# Patient Record
Sex: Male | Born: 1950 | Race: Black or African American | Hispanic: No | Smoking: Current some day smoker
Health system: Southern US, Community
[De-identification: ages and names within clinical notes are randomized; demographics above are authoritative.]

## PROBLEM LIST (undated history)

## (undated) DIAGNOSIS — R011 Cardiac murmur, unspecified: Secondary | ICD-10-CM

## (undated) DIAGNOSIS — I209 Angina pectoris, unspecified: Secondary | ICD-10-CM

## (undated) DIAGNOSIS — I517 Cardiomegaly: Secondary | ICD-10-CM

## (undated) DIAGNOSIS — I10 Essential (primary) hypertension: Secondary | ICD-10-CM

## (undated) DIAGNOSIS — I509 Heart failure, unspecified: Secondary | ICD-10-CM

## (undated) HISTORY — PX: NO PAST SURGERIES: SHX2092

---

## 2004-07-16 ENCOUNTER — Emergency Department: Payer: Self-pay | Admitting: Emergency Medicine

## 2005-01-24 ENCOUNTER — Emergency Department: Payer: Self-pay | Admitting: Emergency Medicine

## 2005-02-27 ENCOUNTER — Emergency Department: Payer: Self-pay | Admitting: Emergency Medicine

## 2006-03-16 IMAGING — CR DG LUMBAR SPINE 2-3V
1 series · 3 of 3 positions shown · non-contrast
Comparison: none

REASON FOR EXAM: Pain
COMMENTS:

[Series 1: view not recorded · 0.17mm/px · 3 of 3 slices shown]
[im 1/3]
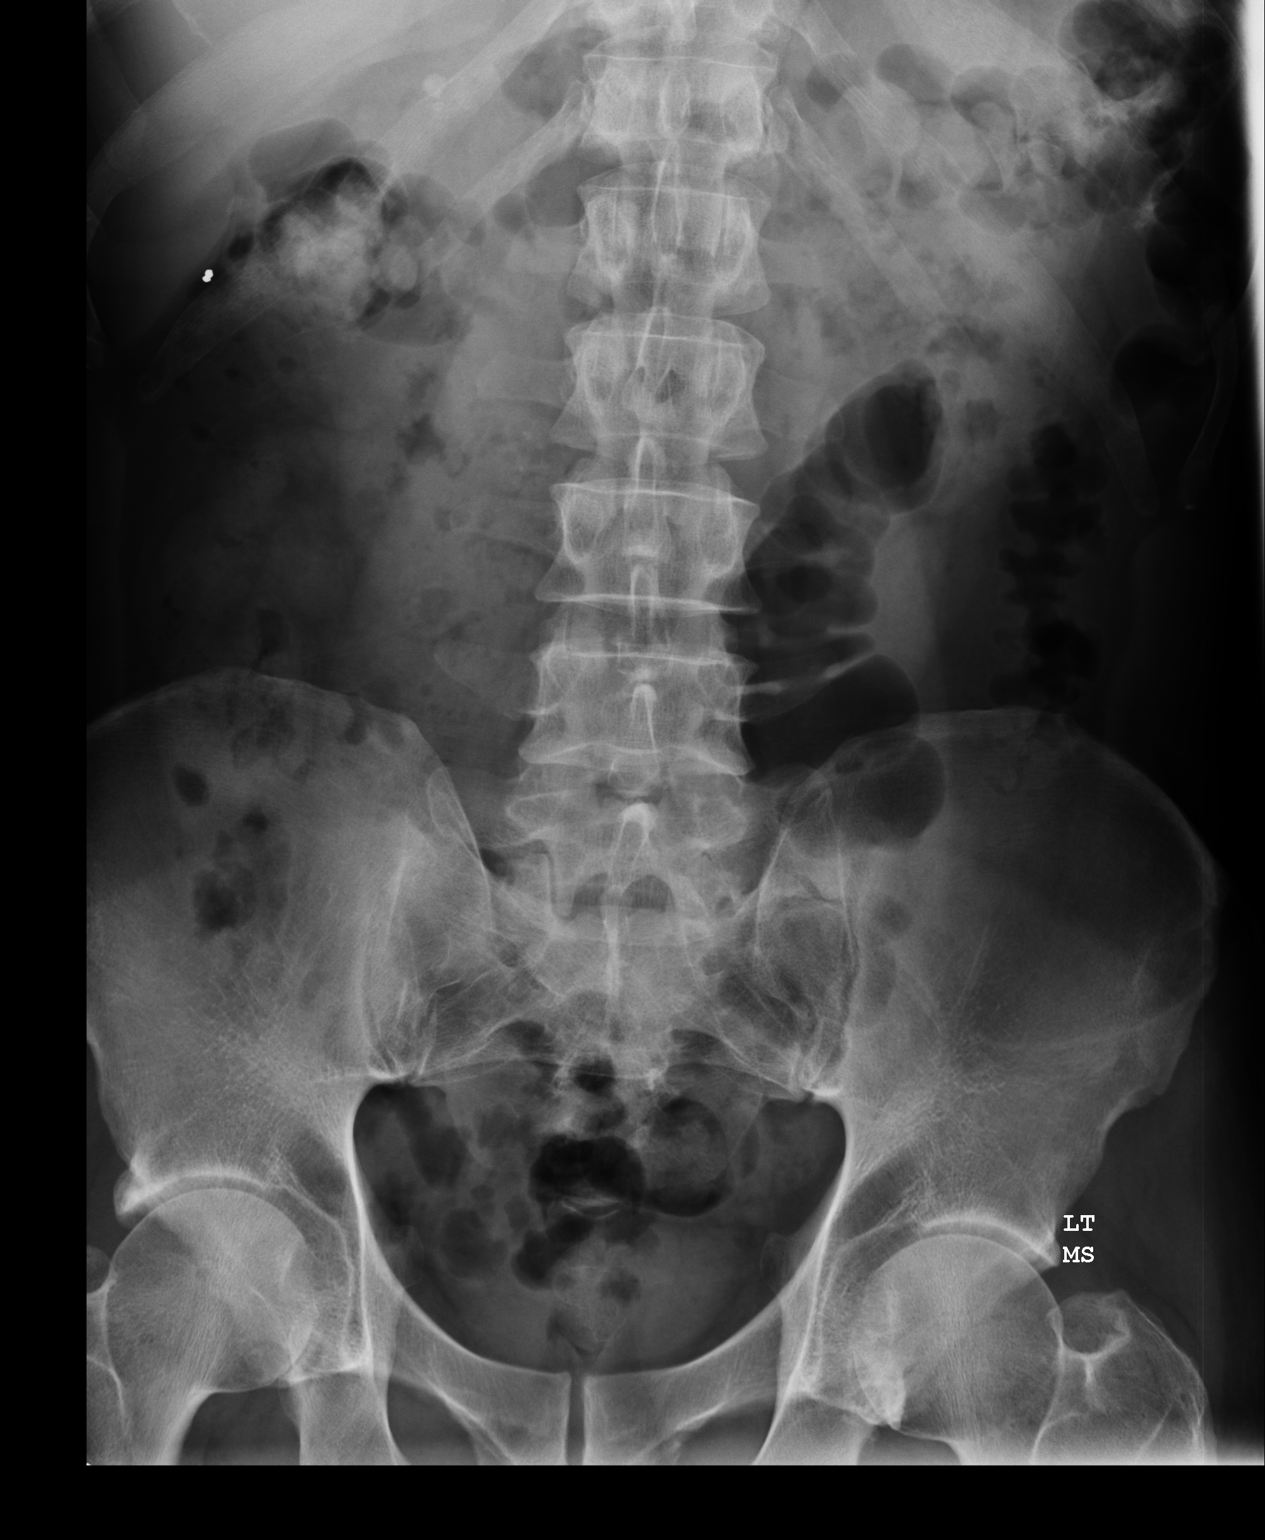
[im 2/3]
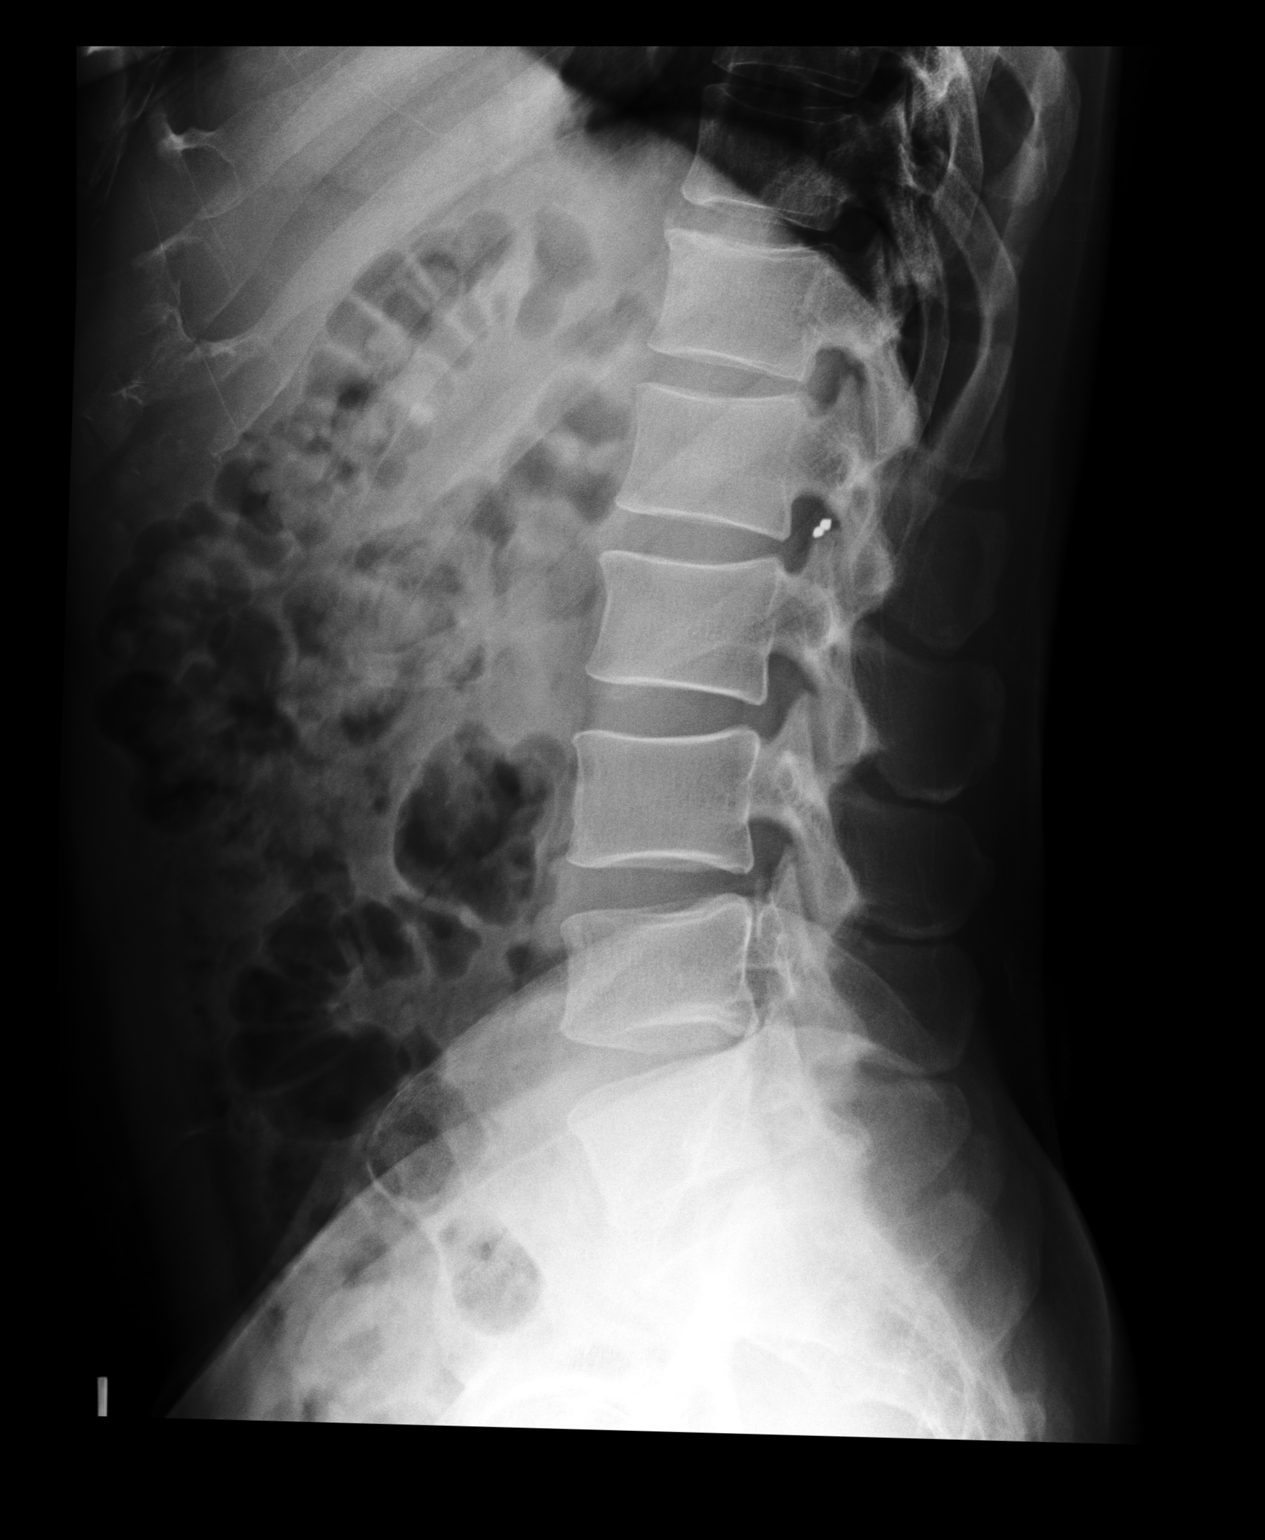
[im 3/3]
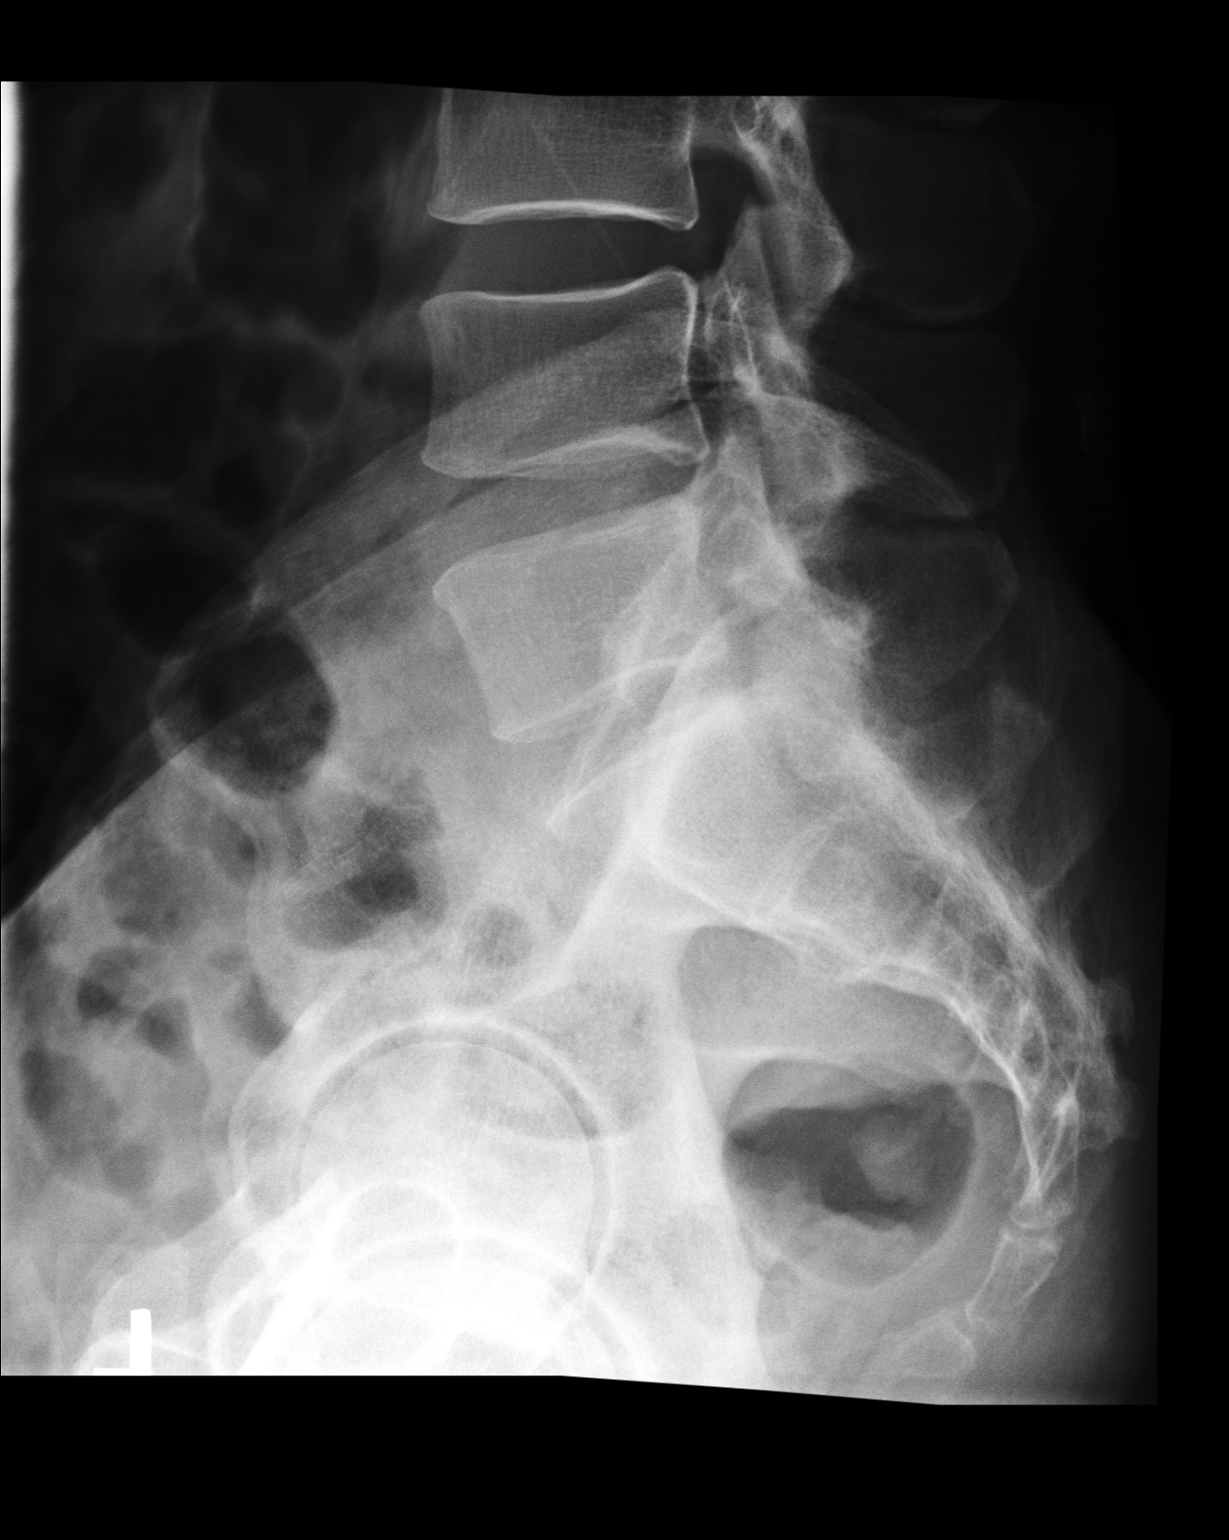

[3 of 3 positions shown; findings below may reference images not displayed]

PROCEDURE:     DXR - DXR LUMBAR SPINE AP AND LATERAL  - January 24, 2005  [DATE]

RESULT:     No acute soft tissue or bony abnormalities are identified.
There is no evidence of fracture or dislocation.  Metallic density is noted
over the RIGHT back.  Calcification is noted over the RIGHT upper quadrant.
This could represent a gallstone or a kidney stone.
IMPRESSION: Please see above.

## 2006-03-16 IMAGING — CR DG HIP COMPLETE 2+V*L*
1 series · 2 of 2 positions shown · non-contrast
Comparison: none

REASON FOR EXAM: Pain in hip
COMMENTS:

[Series 1: view not recorded · 0.17mm/px · 2 of 2 slices shown]
[im 1/2]
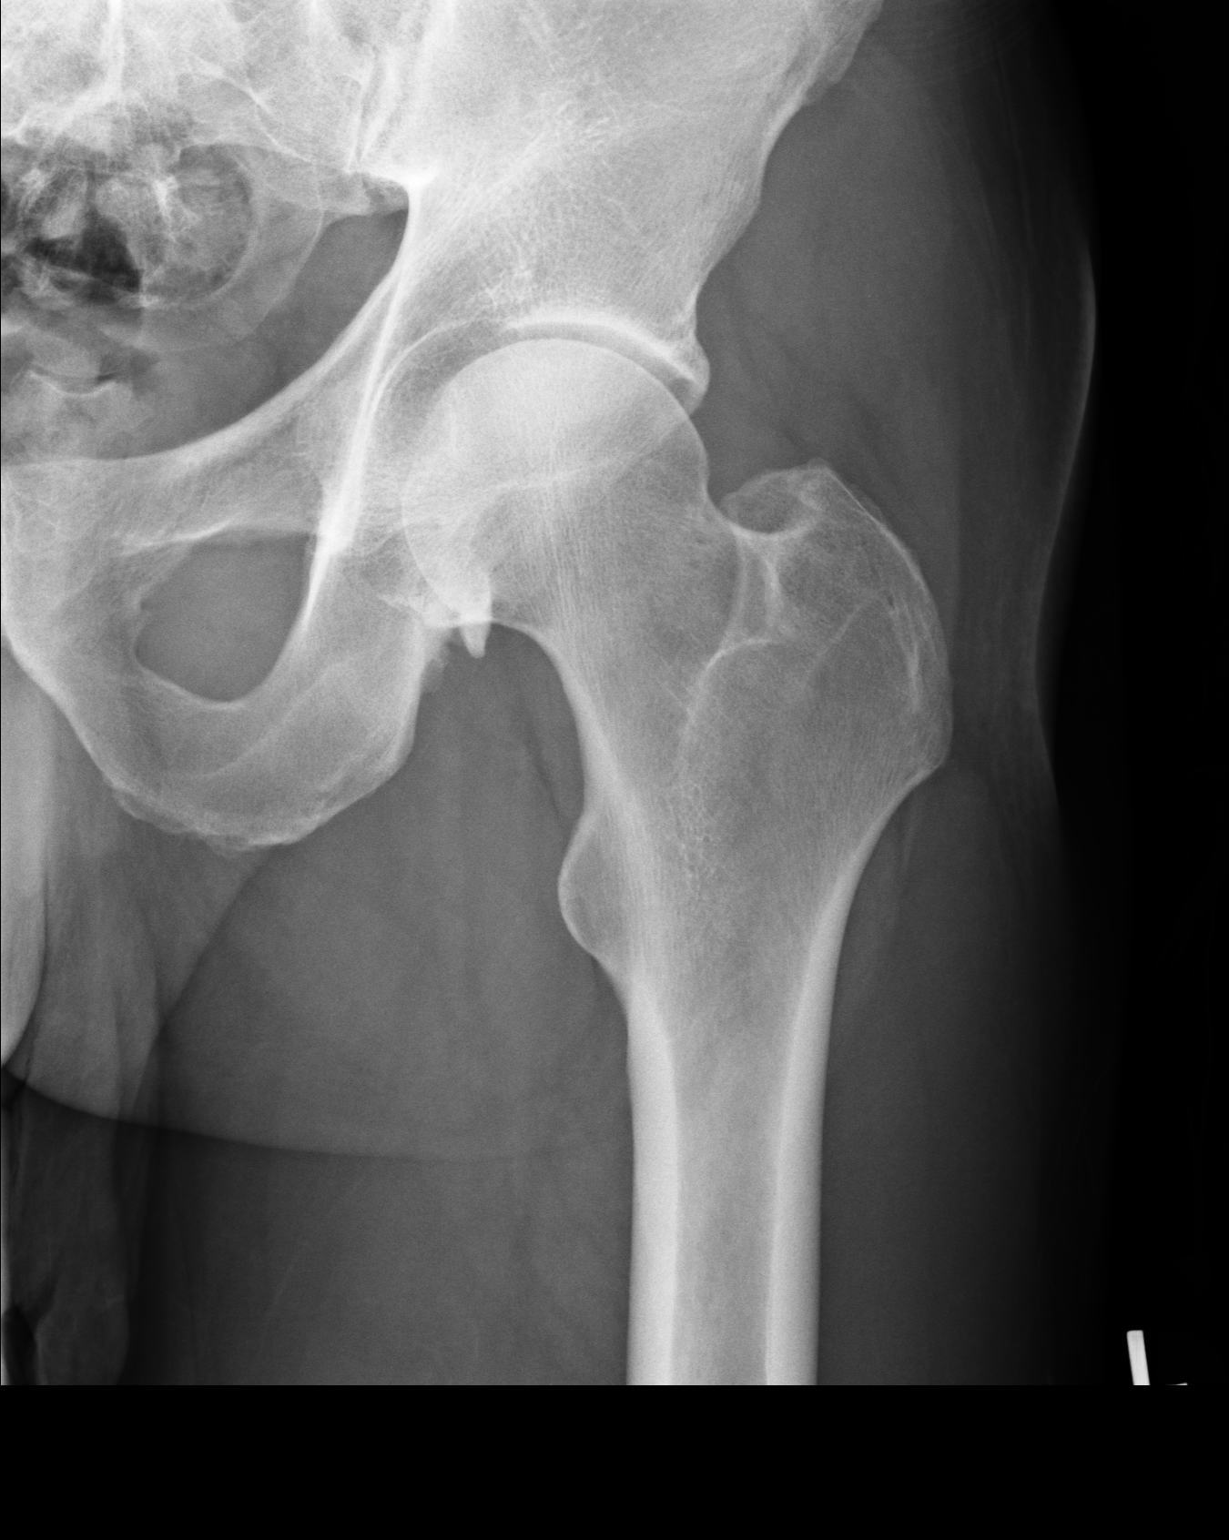
[im 2/2]
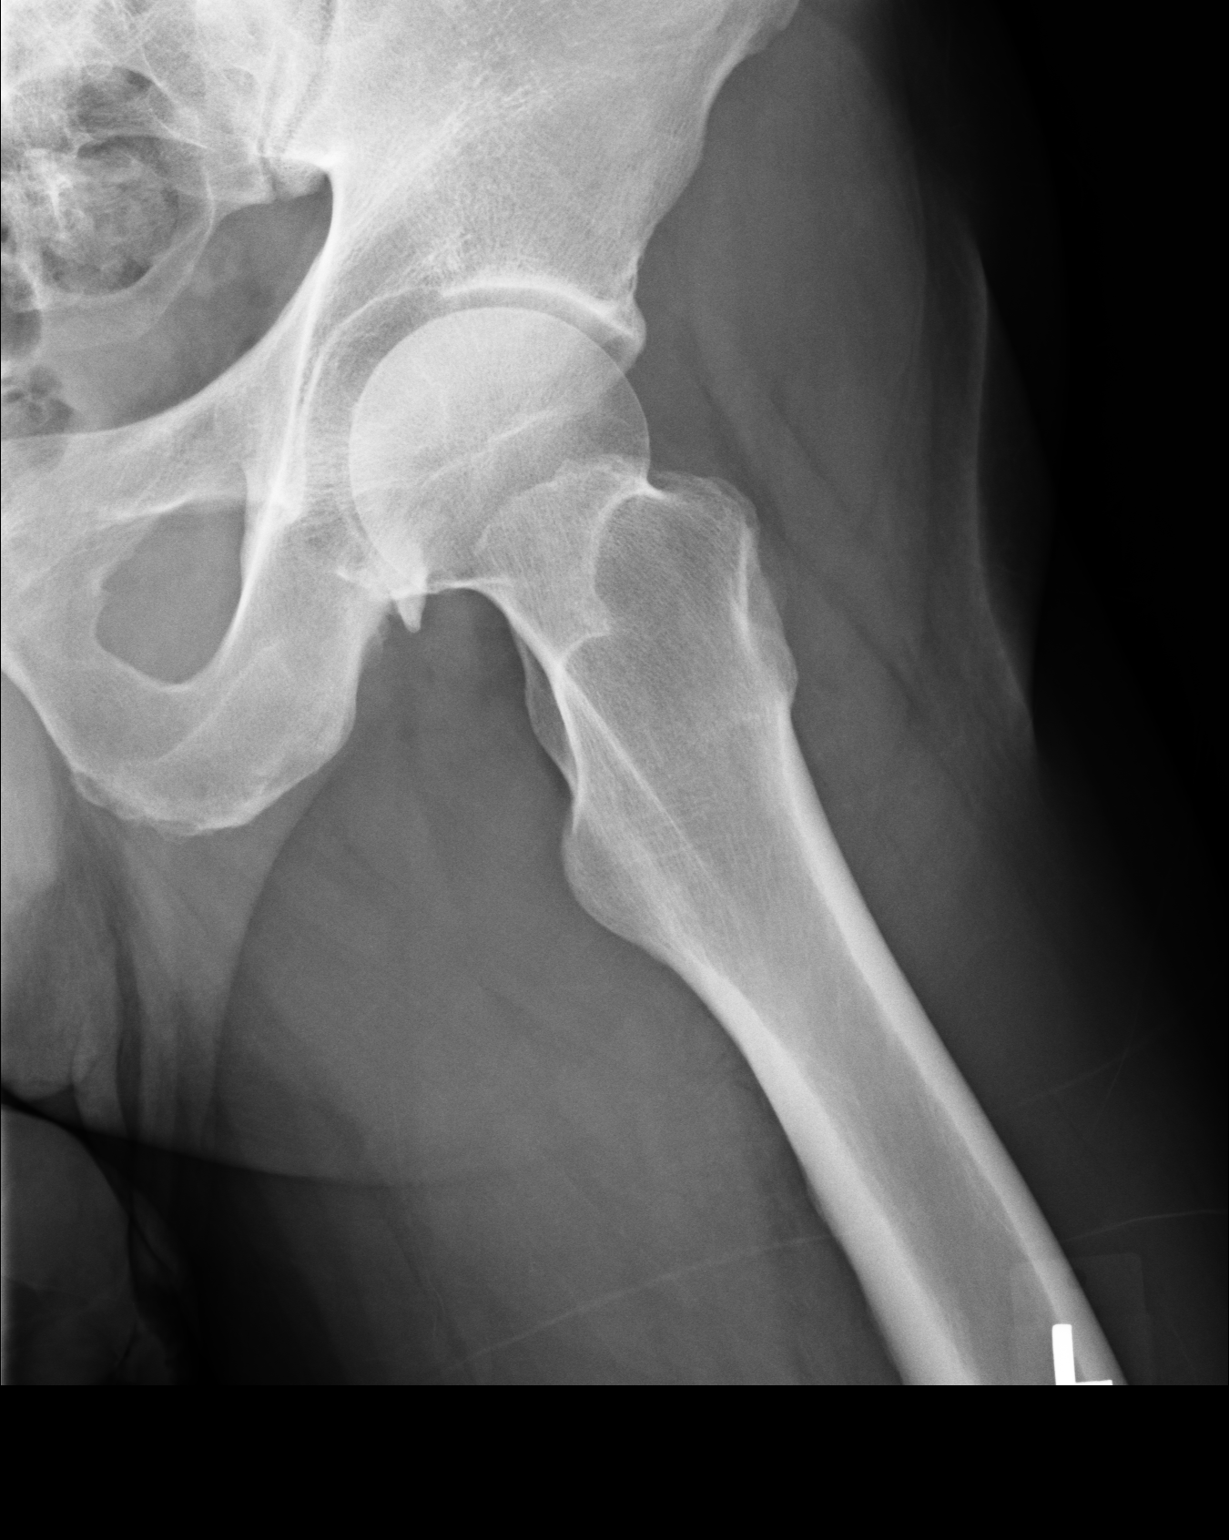

[2 of 2 positions shown; findings below may reference images not displayed]

PROCEDURE:     DXR - DXR HIP LEFT COMPLETE  - January 24, 2005  [DATE]

RESULT:     Deformity is noted of the inferior aspect of the acetabulum.
This may be degenerative or from prior trauma. Ligamentous
calcification/ossification could also present in this fashion. To exclude an
active process, LEFT hip MRI should be considered. There is no evidence of
acute fracture noted.
IMPRESSION: Please see above.

## 2006-03-16 IMAGING — CR PELVIS - 1-2 VIEW
1 series · 1 of 1 positions shown · non-contrast
Comparison: none

REASON FOR EXAM: Pain in pelvis.  [HOSPITAL]
COMMENTS:

[view not recorded]
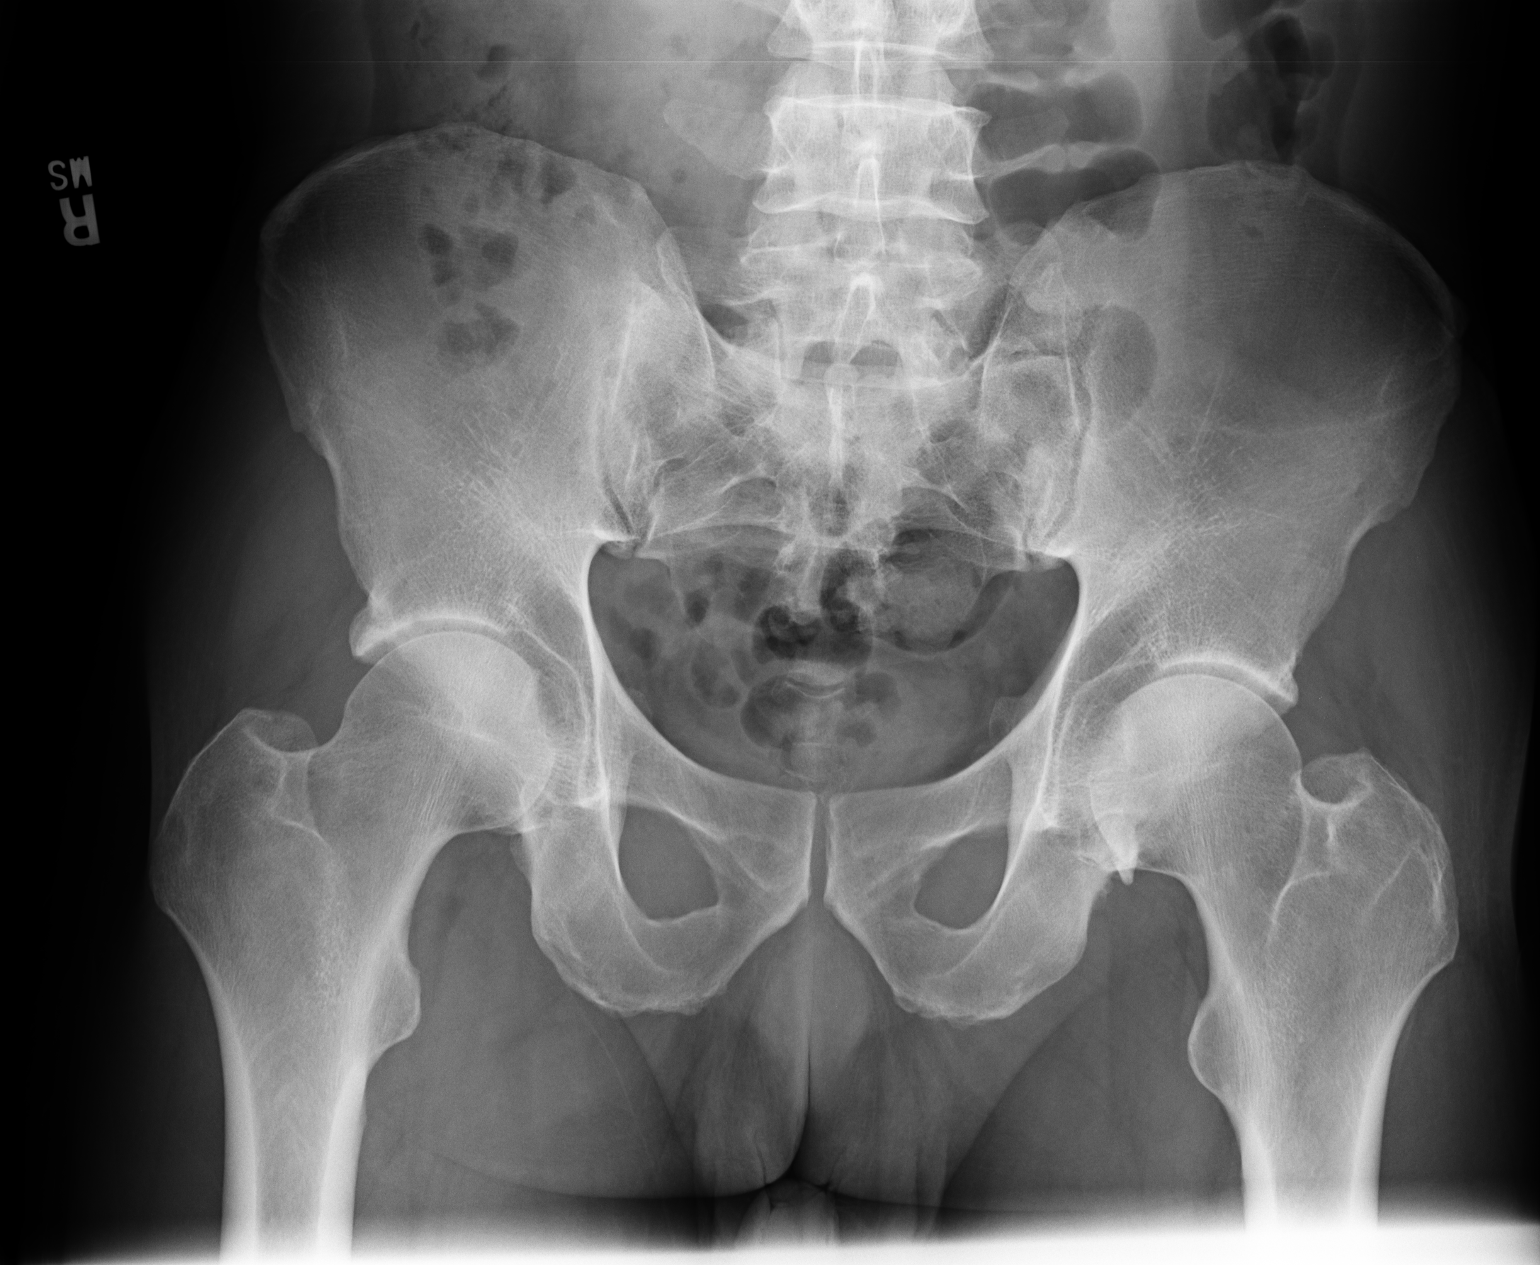

[1 of 1 positions shown; findings below may reference images not displayed]

PROCEDURE:     DXR - DXR PELVIS AP ONLY  - January 24, 2005  [DATE]

RESULT:     Deformity is noted along the inferior acetabular rim.  This may
be a site of old trauma.  Ligamentous calcification and degenerative change
could also present in this fashion.  Given the patient's history of pain, it
may be prudent to perform a LEFT hip MRI to further evaluate this region to
exclude active process.  No acute fracture is noted.  No other focal
abnormalities are identified.
IMPRESSION: Please see above.

## 2006-12-30 ENCOUNTER — Emergency Department: Payer: Self-pay | Admitting: Emergency Medicine

## 2009-06-21 ENCOUNTER — Inpatient Hospital Stay: Payer: Self-pay | Admitting: Internal Medicine

## 2009-07-15 ENCOUNTER — Ambulatory Visit: Payer: Self-pay | Admitting: Family

## 2009-08-11 ENCOUNTER — Ambulatory Visit: Payer: Self-pay | Admitting: Family

## 2009-10-31 ENCOUNTER — Inpatient Hospital Stay: Payer: Self-pay | Admitting: Internal Medicine

## 2010-07-06 ENCOUNTER — Observation Stay: Payer: Self-pay | Admitting: Internal Medicine

## 2011-12-05 ENCOUNTER — Observation Stay (HOSPITAL_COMMUNITY)
Admission: EM | Admit: 2011-12-05 | Discharge: 2011-12-06 | Disposition: A | Discharge status: Home / Self Care | Payer: Medicare Other | Attending: Cardiology | Admitting: Cardiology

## 2011-12-05 ENCOUNTER — Emergency Department (HOSPITAL_COMMUNITY): Payer: Medicare Other

## 2011-12-05 ENCOUNTER — Encounter (HOSPITAL_COMMUNITY): Payer: Self-pay | Admitting: Adult Health

## 2011-12-05 DIAGNOSIS — I5022 Chronic systolic (congestive) heart failure: Secondary | ICD-10-CM | POA: Insufficient documentation

## 2011-12-05 DIAGNOSIS — I509 Heart failure, unspecified: Secondary | ICD-10-CM | POA: Insufficient documentation

## 2011-12-05 DIAGNOSIS — I517 Cardiomegaly: Secondary | ICD-10-CM

## 2011-12-05 DIAGNOSIS — R9431 Abnormal electrocardiogram [ECG] [EKG]: Secondary | ICD-10-CM | POA: Diagnosis present

## 2011-12-05 DIAGNOSIS — F17200 Nicotine dependence, unspecified, uncomplicated: Secondary | ICD-10-CM | POA: Insufficient documentation

## 2011-12-05 DIAGNOSIS — I10 Essential (primary) hypertension: Secondary | ICD-10-CM | POA: Insufficient documentation

## 2011-12-05 DIAGNOSIS — R079 Chest pain, unspecified: Principal | ICD-10-CM | POA: Insufficient documentation

## 2011-12-05 HISTORY — DX: Cardiomegaly: I51.7

## 2011-12-05 HISTORY — DX: Angina pectoris, unspecified: I20.9

## 2011-12-05 HISTORY — DX: Essential (primary) hypertension: I10

## 2011-12-05 HISTORY — DX: Heart failure, unspecified: I50.9

## 2011-12-05 LAB — APTT: aPTT: 29 seconds (ref 24–37)

## 2011-12-05 LAB — CARDIAC PANEL(CRET KIN+CKTOT+MB+TROPI)
CK, MB: 1.7 ng/mL (ref 0.3–4.0)
Relative Index: 0.9 (ref 0.0–2.5)
Total CK: 188 U/L (ref 7–232)

## 2011-12-05 LAB — CBC
HCT: 37 % — ABNORMAL LOW (ref 39.0–52.0)
HCT: 35 % — ABNORMAL LOW (ref 39.0–52.0)
Hemoglobin: 12.1 g/dL — ABNORMAL LOW (ref 13.0–17.0)
MCHC: 32.6 g/dL (ref 30.0–36.0)
MCV: 76.8 fL — ABNORMAL LOW (ref 78.0–100.0)
MCV: 76.8 fL — ABNORMAL LOW (ref 78.0–100.0)
Platelets: 248 10*3/uL (ref 150–400)
RBC: 4.82 MIL/uL (ref 4.22–5.81)
RDW: 14.7 % (ref 11.5–15.5)
WBC: 6.4 10*3/uL (ref 4.0–10.5)

## 2011-12-05 LAB — BASIC METABOLIC PANEL
CO2: 30 mEq/L (ref 19–32)
Chloride: 102 mEq/L (ref 96–112)
Creatinine, Ser: 1.11 mg/dL (ref 0.50–1.35)

## 2011-12-05 LAB — COMPREHENSIVE METABOLIC PANEL
ALT: 12 U/L (ref 0–53)
AST: 22 U/L (ref 0–37)
CO2: 31 mEq/L (ref 19–32)
Calcium: 9.3 mg/dL (ref 8.4–10.5)
GFR calc non Af Amer: 62 mL/min — ABNORMAL LOW (ref >90)
Sodium: 140 mEq/L (ref 135–145)
Total Protein: 7 g/dL (ref 6.0–8.3)

## 2011-12-05 LAB — POCT I-STAT TROPONIN I: Troponin i, poc: 0.01 ng/mL (ref 0.00–0.08)

## 2011-12-05 LAB — PROTIME-INR: Prothrombin Time: 14.4 seconds (ref 11.6–15.2)

## 2011-12-05 MED ORDER — SODIUM CHLORIDE 0.9 % IJ SOLN: 3.0000 mL | INTRAMUSCULAR | Status: DC | PRN

## 2011-12-05 MED ORDER — NITROGLYCERIN 0.4 MG SL SUBL
0.4000 mg | SUBLINGUAL_TABLET | SUBLINGUAL | Status: DC | PRN
  Filled 2011-12-05: qty 25

## 2011-12-05 MED ORDER — NITROGLYCERIN 0.4 MG SL SUBL: 0.4000 mg | SUBLINGUAL_TABLET | SUBLINGUAL | Status: DC | PRN

## 2011-12-05 MED ORDER — FUROSEMIDE 20 MG PO TABS
20.0000 mg | ORAL_TABLET | Freq: Every day | ORAL | Status: DC
  Filled 2011-12-05 (×2): qty 1

## 2011-12-05 MED ORDER — SODIUM CHLORIDE 0.9 % IV SOLN: 250.0000 mL | INTRAVENOUS | Status: DC | PRN

## 2011-12-05 MED ORDER — ASPIRIN 81 MG PO CHEW
243.0000 mg | CHEWABLE_TABLET | Freq: Once | ORAL | Status: AC
  Administered 2011-12-05: 15:00:00 via ORAL

## 2011-12-05 MED ORDER — ASPIRIN 81 MG PO CHEW: 324.0000 mg | CHEWABLE_TABLET | ORAL | Status: DC

## 2011-12-05 MED ORDER — ASPIRIN 300 MG RE SUPP
300.0000 mg | RECTAL | Status: DC
  Filled 2011-12-05: qty 1

## 2011-12-05 MED ORDER — LISINOPRIL 5 MG PO TABS
5.0000 mg | ORAL_TABLET | Freq: Two times a day (BID) | ORAL | Status: DC
  Administered 2011-12-05: 5 mg via ORAL
  Filled 2011-12-05 (×3): qty 1

## 2011-12-05 MED ORDER — ASPIRIN EC 81 MG PO TBEC
81.0000 mg | DELAYED_RELEASE_TABLET | Freq: Every day | ORAL | Status: DC
  Filled 2011-12-05 (×2): qty 1

## 2011-12-05 MED ORDER — AMLODIPINE BESYLATE 5 MG PO TABS
5.0000 mg | ORAL_TABLET | Freq: Every evening | ORAL | Status: DC
  Filled 2011-12-05 (×2): qty 1

## 2011-12-05 MED ORDER — ONDANSETRON HCL 4 MG/2ML IJ SOLN: 4.0000 mg | Freq: Four times a day (QID) | INTRAMUSCULAR | Status: DC | PRN

## 2011-12-05 MED ORDER — HEPARIN SODIUM (PORCINE) 5000 UNIT/ML IJ SOLN
5000.0000 [IU] | Freq: Three times a day (TID) | INTRAMUSCULAR | Status: DC
  Administered 2011-12-05: 5000 [IU] via SUBCUTANEOUS
  Filled 2011-12-05 (×5): qty 1

## 2011-12-05 MED ORDER — ASPIRIN EC 81 MG PO TBEC
81.0000 mg | DELAYED_RELEASE_TABLET | Freq: Every day | ORAL | Status: DC
  Filled 2011-12-05: qty 1

## 2011-12-05 MED ORDER — ACETAMINOPHEN 325 MG PO TABS: 650.0000 mg | ORAL_TABLET | ORAL | Status: DC | PRN

## 2011-12-05 MED ORDER — ASPIRIN 81 MG PO CHEW
CHEWABLE_TABLET | ORAL | Status: AC
  Filled 2011-12-05: qty 4

## 2011-12-05 MED ORDER — ASPIRIN 325 MG PO TABS: 325.0000 mg | ORAL_TABLET | ORAL | Status: DC

## 2011-12-05 MED ORDER — SPIRONOLACTONE 12.5 MG HALF TABLET
12.5000 mg | ORAL_TABLET | Freq: Every day | ORAL | Status: DC
  Filled 2011-12-05 (×2): qty 1

## 2011-12-05 MED ORDER — SODIUM CHLORIDE 0.9 % IJ SOLN
3.0000 mL | Freq: Two times a day (BID) | INTRAMUSCULAR | Status: DC
  Administered 2011-12-05: 3 mL via INTRAVENOUS

## 2011-12-05 MED ORDER — CARVEDILOL 12.5 MG PO TABS
12.5000 mg | ORAL_TABLET | Freq: Two times a day (BID) | ORAL | Status: DC
  Administered 2011-12-06: 12.5 mg via ORAL
  Filled 2011-12-05 (×3): qty 1

## 2011-12-05 NOTE — ED Notes (Signed)
Pt denies pain at this time
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
## 2011-12-05 NOTE — ED Notes (Signed)
C/o intermittent chest pain every 15 minutes that began 2 days ago located in center of chest associated with SOB described as sharp, nothing makes the pain better and nothing makes the pain worse. HX CHF and hypertension.

## 2011-12-05 NOTE — ED Provider Notes (Signed)
I saw and evaluated the patient, reviewed the resident's note and I agree with the findings and plan.   .Face to face Exam:  General:  Awake HEENT:  Atraumatic Resp:  Normal effort Abd:  Nondistended Neuro:No focal weakness Lymph: No adenopathy   Nelia Shi, MD 12/05/11 2010

## 2011-12-06 LAB — CBC
HCT: 34.4 % — ABNORMAL LOW (ref 39.0–52.0)
Hemoglobin: 11.1 g/dL — ABNORMAL LOW (ref 13.0–17.0)
MCV: 76.6 fL — ABNORMAL LOW (ref 78.0–100.0)
RBC: 4.49 MIL/uL (ref 4.22–5.81)
WBC: 7 10*3/uL (ref 4.0–10.5)

## 2011-12-06 LAB — CARDIAC PANEL(CRET KIN+CKTOT+MB+TROPI): Total CK: 172 U/L (ref 7–232)

## 2011-12-06 LAB — BASIC METABOLIC PANEL
BUN: 16 mg/dL (ref 6–23)
CO2: 30 mEq/L (ref 19–32)
Chloride: 98 mEq/L (ref 96–112)
Creatinine, Ser: 1.29 mg/dL (ref 0.50–1.35)
Glucose, Bld: 106 mg/dL — ABNORMAL HIGH (ref 70–99)

## 2011-12-06 LAB — LIPID PANEL: LDL Cholesterol: 82 mg/dL (ref 0–99)

## 2011-12-06 MED ORDER — POTASSIUM CHLORIDE CRYS ER 20 MEQ PO TBCR: 40.0000 meq | EXTENDED_RELEASE_TABLET | Freq: Once | ORAL | Status: DC

## 2011-12-06 NOTE — Progress Notes (Signed)
Pt discharged home, HF video shown this am and booklet given.  Pt verbalizes understanding.  Pt taken of cardiac monitor, no pain stated.  NS at time of discharge.  Pt aware to follow up with Dr. Pernell Dupre, PCP.  Pt refused to take medications from hospital.  Pt educated on importance to follow medication regimen.  Pt accompanied to private vehicle by family.

## 2011-12-06 NOTE — Progress Notes (Signed)
Utilization review completed.  

## 2011-12-06 NOTE — Discharge Summary (Addendum)
Patient ID: Jesse Galvan MRN: [id number] DOB/AGE: 1951/03/11 61 y.o.  Admit date: 12/05/2011 Discharge date: 12/06/2011  Primary Discharge Diagnosis: Chest pain  Secondary Discharge Diagnosis: Chronic systolic heart failure, hypertension, tobacco use, hypokalemia  Hospital Course: 61 year old male with chronic systolic heart failure treated at [facility] by Dr. Pernell Dupre who presented with fleeting sharp, substernal chest discomfort that occurred while cutting the grass lasting approximately 15-20 seconds with no radiation, no shortness of breath. He has not had any fevers, chills, cough, dysphagia, bleeding. Does not report heavy alcohol intake, 4-5 beers a week. The pain occurred at approximately 11 AM yesterday, presented to the emergency room in the early afternoon. 3 troponins were negative. His EKG demonstrated T wave inversion most prominent in lateral precordial leads as well as mild T wave inversion in the inferior leads. LVH was present as well. Because of his age, EKG and other cardiac risk factors including prior systolic heart failure, he was admitted for observation. He was also discovered during admission that he had a court date today at 9 AM.  Overnight, he felt well with no return of chest pain. He did not have any further chest pain since admission through the emergency room. He reports compliance with his medications. He states that he smokes approximately 5 cigarettes per week. Tobacco cessation discussed.  On telemetry, he had no adverse arrhythmias, sinus bradycardia heart rate of 48 noted but asymptomatic. He is on carvedilol.  In regards to cardiac testing, he does report stress test performed at [facility], and he also may have had one done at [facility] in the recent past. When asked about coronary artery evaluation other than stress test, he denies cardiac catheterization but believes that his coronary arteries were evaluated by CT scan  at [facility].  Nonetheless, he feels well, intubating well, no dyspnea, no orthopnea. His BNP on admission was 105 within our normal range. Creatinine ranged from 1.1-1.29. His potassium was 3.5 on admission and 3.0 in the early morning hours prior to discharge. Potassium supplementation was given. LDL cholesterol 82, HDL 64. Hemoglobin was 11.1-12.1. No evidence of bleeding.  I will discharge him and explained to him that he does need to have followup with Dr. Pernell Dupre at [facility]. He is comfortable with this. He stated that he needed to make an appointment with him anyway.    Discharge Exam: Blood pressure 127/67, pulse 50, temperature 97.2 F (36.2 C), temperature source Oral, resp. rate 18, weight 78.2 kg (172 lb 6.4 oz), SpO2 97.00%.    General: Alert and oriented x3 in no acute distress Cardiovascular: Regular rate and rhythm, no S3 gallop, no murmurs, no JVD Lungs: Clear to auscultation bilaterally Abdomen: Soft, nontender, normal bowel sounds Extremities: No clubbing cyanosis or edema  Chest x-ray showed borderline cardiomegaly, no evidence of pulmonary edema.  Labs:   Lab Results  Component Value Date   WBC 7.0 12/06/2011   HGB 11.1* 12/06/2011   HCT 34.4* 12/06/2011   MCV 76.6* 12/06/2011   PLT 214 12/06/2011    Lab 12/06/11 0305 12/05/11 2120  NA 137 --  K 3.0* --  CL 98 --  CO2 30 --  BUN 16 --  CREATININE 1.29 --  CALCIUM 9.2 --  PROT -- 7.0  BILITOT -- 0.5  ALKPHOS -- 57  ALT -- 12  AST -- 22  GLUCOSE 106* --   Lab Results  Component Value Date   CKTOTAL 172 12/06/2011   CKMB 1.6 12/06/2011  TROPONINI <0.30 12/06/2011    Lab Results  Component Value Date   CHOL 173 12/06/2011   Lab Results  Component Value Date   HDL 64 12/06/2011   Lab Results  Component Value Date   LDLCALC 82 12/06/2011   Lab Results  Component Value Date   TRIG 137 12/06/2011   Lab Results  Component Value Date   CHOLHDL 2.7 12/06/2011      FOLLOW UP PLANS AND APPOINTMENTS Discharge Orders     Future Orders Please Complete By Expires   Diet - low sodium heart healthy      Increase activity slowly        Medication List  As of 12/06/2011  7:04 AM   TAKE these medications         amLODipine 5 MG tablet   Commonly known as: NORVASC   Take 5 mg by mouth every evening.      aspirin EC 81 MG tablet   Take 81 mg by mouth daily.      carvedilol 12.5 MG tablet   Commonly known as: COREG   Take 12.5 mg by mouth 2 (two) times daily with a meal.      furosemide 20 MG tablet   Commonly known as: LASIX   Take 20 mg by mouth daily.      lisinopril 5 MG tablet   Commonly known as: PRINIVIL,ZESTRIL   Take 5 mg by mouth 2 (two) times daily.      spironolactone 25 MG tablet   Commonly known as: ALDACTONE   Take 12.5 mg by mouth daily.            Followup will be with Dr. Pernell Dupre at [facility]. Patient is instructed to make appointment. I attempted to fax discharge summary to Dr. Pernell Dupre however his information was not within the computer system.  BRING ALL MEDICATIONS WITH YOU TO FOLLOW UP APPOINTMENTS  Time spent with patient to include physician time: Signed: Jayla Mackie 12/06/2011, 7:04 AM

## 2011-12-06 NOTE — Progress Notes (Signed)
Pt. Arrived to floor via wheelchair from ED. Pt. Alert and oriented x4 in stable condition. No distress or SOB noted. Pt. Denies pain. Pt. Placed on telemetry. 1st degree AV block on the monitor. Will continue to monitor pt. Jesse Galvan, [name]

## 2012-12-19 ENCOUNTER — Emergency Department: Payer: Self-pay | Admitting: Emergency Medicine

## 2015-01-15 ENCOUNTER — Emergency Department: Payer: Medicare Other

## 2015-01-15 ENCOUNTER — Emergency Department
Admission: EM | Admit: 2015-01-15 | Discharge: 2015-01-15 | Disposition: A | Discharge status: Home / Self Care | Payer: Medicare Other | Attending: Emergency Medicine | Admitting: Emergency Medicine

## 2015-01-15 ENCOUNTER — Encounter: Payer: Self-pay | Admitting: Emergency Medicine

## 2015-01-15 DIAGNOSIS — Z720 Tobacco use: Secondary | ICD-10-CM | POA: Insufficient documentation

## 2015-01-15 DIAGNOSIS — Z79899 Other long term (current) drug therapy: Secondary | ICD-10-CM | POA: Insufficient documentation

## 2015-01-15 DIAGNOSIS — R079 Chest pain, unspecified: Secondary | ICD-10-CM | POA: Diagnosis not present

## 2015-01-15 DIAGNOSIS — Z7982 Long term (current) use of aspirin: Secondary | ICD-10-CM | POA: Diagnosis not present

## 2015-01-15 LAB — MAGNESIUM: Magnesium: 1.7 mg/dL (ref 1.7–2.4)

## 2015-01-15 LAB — CBC
HCT: 36.5 % — ABNORMAL LOW (ref 40.0–52.0)
HEMOGLOBIN: 11.9 g/dL — AB (ref 13.0–18.0)
MCH: 25.7 pg — ABNORMAL LOW (ref 26.0–34.0)
MCHC: 32.5 g/dL (ref 32.0–36.0)
MCV: 79 fL — ABNORMAL LOW (ref 80.0–100.0)
Platelets: 188 10*3/uL (ref 150–440)
RBC: 4.62 MIL/uL (ref 4.40–5.90)
RDW: 14.7 % — AB (ref 11.5–14.5)
WBC: 7.3 10*3/uL (ref 3.8–10.6)

## 2015-01-15 LAB — URINALYSIS COMPLETE WITH MICROSCOPIC (ARMC ONLY)
BILIRUBIN URINE: NEGATIVE
Bacteria, UA: NONE SEEN
Glucose, UA: NEGATIVE mg/dL
HGB URINE DIPSTICK: NEGATIVE
KETONES UR: NEGATIVE mg/dL
LEUKOCYTES UA: NEGATIVE
NITRITE: NEGATIVE
PH: 5 (ref 5.0–8.0)
Protein, ur: 100 mg/dL — AB
Specific Gravity, Urine: 1.028 (ref 1.005–1.030)

## 2015-01-15 LAB — PROTIME-INR
INR: 0.97
Prothrombin Time: 13.1 seconds (ref 11.4–15.0)

## 2015-01-15 LAB — COMPREHENSIVE METABOLIC PANEL
ALBUMIN: 3.9 g/dL (ref 3.5–5.0)
ALK PHOS: 64 U/L (ref 38–126)
ALT: 20 U/L (ref 17–63)
ANION GAP: 7 (ref 5–15)
AST: 31 U/L (ref 15–41)
BUN: 16 mg/dL (ref 6–20)
CHLORIDE: 105 mmol/L (ref 101–111)
CO2: 28 mmol/L (ref 22–32)
Calcium: 9.1 mg/dL (ref 8.9–10.3)
Creatinine, Ser: 1.29 mg/dL — ABNORMAL HIGH (ref 0.61–1.24)
GFR calc Af Amer: >60 mL/min (ref >60)
GFR calc non Af Amer: 57 mL/min — ABNORMAL LOW (ref >60)
GLUCOSE: 112 mg/dL — AB (ref 65–99)
POTASSIUM: 3.3 mmol/L — AB (ref 3.5–5.1)
SODIUM: 140 mmol/L (ref 135–145)
Total Bilirubin: 0.5 mg/dL (ref 0.3–1.2)
Total Protein: 7.3 g/dL (ref 6.5–8.1)

## 2015-01-15 LAB — BRAIN NATRIURETIC PEPTIDE: B Natriuretic Peptide: 70 pg/mL (ref 0.0–100.0)

## 2015-01-15 LAB — APTT: APTT: 27 s (ref 24–36)

## 2015-01-15 LAB — TROPONIN I
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
## 2015-01-15 NOTE — ED Notes (Signed)
X-ray at bedside

## 2015-01-15 NOTE — ED Provider Notes (Addendum)
[place] [facility] Emergency Department Provider Note  ____________________________________________  Time seen: Approximately 3:49 AM  I have reviewed the triage vital signs and the nursing notes.   HISTORY  Chief Complaint Chest Pain    HPI Jesse Passey Sr. is a 64 y.o. male patient reports he developed chest pain at about 3:00 this morning's sharp in nature. Does not seem to radiate anywhere. It feels like when he had congestive heart failure last time. He is not short of breath. He does not have any edema. The pain is not made worse with deep breathing or movement. The pain is not made worse with exercise. Pain is moderate in nature.   Past Medical History  Diagnosis Date  . CHF (congestive heart failure)     Dr. Devonne Doughty, [facility] [employer]  . Hypertension   . Anginal pain   . Cardiomegaly 12/05/2011    /H&P    Patient Active Problem List   Diagnosis Date Noted  . Chest pain, unspecified 12/05/2011  . Nonspecific abnormal electrocardiogram (ECG) (EKG) 12/05/2011  . Essential hypertension, benign 12/05/2011  . Chronic systolic heart failure 12/05/2011    Past Surgical History  Procedure Laterality Date  . No past surgeries      Current Outpatient Rx  Name  Route  Sig  Dispense  Refill  . amLODipine (NORVASC) 5 MG tablet   Oral   Take 5 mg by mouth every evening.         Marland Kitchen aspirin EC 81 MG tablet   Oral   Take 81 mg by mouth daily.         . carvedilol (COREG) 12.5 MG tablet   Oral   Take 12.5 mg by mouth 2 (two) times daily with a meal.         . furosemide (LASIX) 20 MG tablet   Oral   Take 40 mg by mouth daily.          Marland Kitchen lisinopril (PRINIVIL,ZESTRIL) 5 MG tablet   Oral   Take 5 mg by mouth 2 (two) times daily.         . pravastatin (PRAVACHOL) 20 MG tablet   Oral   Take 20 mg by mouth every evening.         . sertraline (ZOLOFT) 25 MG tablet   Oral   Take 25 mg by mouth daily.         Marland Kitchen  spironolactone (ALDACTONE) 25 MG tablet   Oral   Take 50 mg by mouth daily.            Allergies Review of patient's allergies indicates no known allergies.  History reviewed. No pertinent family history.  Social History Social History  Substance Use Topics  . Smoking status: Current Some Day Smoker -- 0.10 packs/day  . Smokeless tobacco: Never Used     Comment: 12/05/2011 States that he smokes 5 cigarettes a week  . Alcohol Use: 3.0 oz/week    5 Cans of beer per week    Review of Systems Constitutional: No fever/chills Eyes: No visual changes. ENT: No sore throat. Cardiovascular: Denies chest pain. Respiratory: Denies shortness of breath. Gastrointestinal: No abdominal pain.  No nausea, no vomiting.  No diarrhea.  No constipation. Genitourinary: Negative for dysuria. Musculoskeletal: Negative for back pain. Skin: Negative for rash. Neurological: Negative for headaches, focal weakness or numbness. 10 point review of systems negative  ____________________________________________   PHYSICAL EXAM:  VITAL SIGNS: ED Triage Vitals  Enc Vitals  Group     BP 01/15/15 0345 178/108 mmHg     Pulse Rate 01/15/15 0345 64     Resp 01/15/15 0345 17     Temp 01/15/15 0345 98 F (36.7 C)     Temp Source 01/15/15 0345 Oral     SpO2 01/15/15 0345 98 %     Weight 01/15/15 0345 172 lb (78.019 kg)     Height 01/15/15 0345 6' (1.829 m)     Head Cir --      Peak Flow --      Pain Score 01/15/15 0346 7     Pain Loc --      Pain Edu? --      Excl. in GC? --     Constitutional: Alert and oriented. Well appearing and in no acute distress. Eyes: Conjunctivae are normal. PERRL. EOMI. Head: Atraumatic. Nose: No congestion/rhinnorhea. Mouth/Throat: Mucous membranes are moist.  Oropharynx non-erythematous. Neck: No stridor Cardiovascular: Normal rate, regular rhythm. Grossly normal heart sounds.  Good peripheral circulation. Chest is nontender palpation Respiratory: Normal respiratory  effort.  No retractions. Lungs CTAB. Gastrointestinal: Soft and nontender. No distention. No abdominal bruits. No CVA tenderness. Musculoskeletal: No lower extremity tenderness nor edema.  No joint effusions. Neurologic:  Normal speech and language. No gross focal neurologic deficits are appreciated. No gait instability. Skin:  Skin is warm, dry and intact. No rash noted. Psychiatric: Mood and affect are normal. Speech and behavior are normal.  ____________________________________________   LABS (all labs ordered are listed, but only abnormal results are displayed)  Labs Reviewed  CBC - Abnormal; Notable for the following:    Hemoglobin 11.9 (*)    HCT 36.5 (*)    MCV 79.0 (*)    MCH 25.7 (*)    RDW 14.7 (*)    All other components within normal limits  COMPREHENSIVE METABOLIC PANEL - Abnormal; Notable for the following:    Potassium 3.3 (*)    Glucose, Bld 112 (*)    Creatinine, Ser 1.29 (*)    GFR calc non Af Amer 57 (*)    All other components within normal limits  APTT  PROTIME-INR  TROPONIN I  MAGNESIUM  TROPONIN I  TROPONIN I  URINALYSIS COMPLETEWITH MICROSCOPIC ([facility] ONLY)  BRAIN NATRIURETIC PEPTIDE  TROPONIN I   ____________________________________________  EKG  EKG read and interpreted by me shows sinus bradycardia rate of 58 first-degree AV block. There are flipped T waves in V5 and 6. There is slightly deeper inversions that on previous EKGs. There is LVH present ____________________________________________  RADIOLOGY  Chest x-ray is read as cardiomegaly and otherwise normal by radiology ____________________________________________   PROCEDURES    ____________________________________________   INITIAL IMPRESSION / ASSESSMENT AND PLAN / ED COURSE  Pertinent labs & imaging results that were available during my care of the patient were reviewed by me and considered in my medical decision making (see chart for details). Patient reports he sees a  cardiologist at [facility]. He has an appointment on the 23rd of this month. I told him he should call Monday and see if he can get that appointment moved up sooner or follow up with one of our cardiologists if all the blood work does indeed come back as normal  ____________________________________________   FINAL CLINICAL IMPRESSION(S) / ED DIAGNOSES  Final diagnoses:  Chest pain, unspecified chest pain type      Arnaldo Natal, MD 01/15/15 [phone]  Dr. Mayford Knife will check the second troponin in nature and  the final disposition is correct  Arnaldo Natal, MD 01/15/15 [phone]

## 2015-01-15 NOTE — ED Provider Notes (Signed)
Patient with negative troponin 2, stable for outpatient follow-up with his cardiologist.  Emily Filbert, MD 01/15/15 [phone]

## 2015-01-15 NOTE — Discharge Instructions (Signed)

## 2015-02-07 ENCOUNTER — Emergency Department: Admission: EM | Admit: 2015-02-07 | Payer: Medicare Other | Source: Home / Self Care

## 2015-02-07 NOTE — ED Notes (Addendum)
Pt to ed and states he was sent by his md for blood draw.  After speaking with someone at Dr.  Pernell Dupre office it was explained that pt should be in outpt for lab draw.  Pt verbalized understanding.

## 2015-06-20 ENCOUNTER — Encounter: Payer: Self-pay | Admitting: Emergency Medicine

## 2015-06-20 ENCOUNTER — Emergency Department
Admission: EM | Admit: 2015-06-20 | Discharge: 2015-06-21 | Disposition: A | Discharge status: Home / Self Care | Payer: Medicare Other | Attending: Emergency Medicine | Admitting: Emergency Medicine

## 2015-06-20 DIAGNOSIS — Y9389 Activity, other specified: Secondary | ICD-10-CM | POA: Diagnosis not present

## 2015-06-20 DIAGNOSIS — Y9289 Other specified places as the place of occurrence of the external cause: Secondary | ICD-10-CM | POA: Insufficient documentation

## 2015-06-20 DIAGNOSIS — W06XXXA Fall from bed, initial encounter: Secondary | ICD-10-CM | POA: Insufficient documentation

## 2015-06-20 DIAGNOSIS — T148 Other injury of unspecified body region: Secondary | ICD-10-CM | POA: Diagnosis not present

## 2015-06-20 DIAGNOSIS — F17210 Nicotine dependence, cigarettes, uncomplicated: Secondary | ICD-10-CM | POA: Insufficient documentation

## 2015-06-20 DIAGNOSIS — T148XXA Other injury of unspecified body region, initial encounter: Secondary | ICD-10-CM

## 2015-06-20 DIAGNOSIS — I10 Essential (primary) hypertension: Secondary | ICD-10-CM | POA: Insufficient documentation

## 2015-06-20 DIAGNOSIS — R0789 Other chest pain: Secondary | ICD-10-CM

## 2015-06-20 DIAGNOSIS — S299XXA Unspecified injury of thorax, initial encounter: Secondary | ICD-10-CM | POA: Insufficient documentation

## 2015-06-20 DIAGNOSIS — R0602 Shortness of breath: Secondary | ICD-10-CM | POA: Diagnosis not present

## 2015-06-20 DIAGNOSIS — Z79899 Other long term (current) drug therapy: Secondary | ICD-10-CM | POA: Diagnosis not present

## 2015-06-20 DIAGNOSIS — Y998 Other external cause status: Secondary | ICD-10-CM | POA: Diagnosis not present

## 2015-06-20 DIAGNOSIS — Z7982 Long term (current) use of aspirin: Secondary | ICD-10-CM | POA: Insufficient documentation

## 2015-06-20 DIAGNOSIS — E876 Hypokalemia: Secondary | ICD-10-CM

## 2015-06-20 DIAGNOSIS — S46912A Strain of unspecified muscle, fascia and tendon at shoulder and upper arm level, left arm, initial encounter: Secondary | ICD-10-CM

## 2015-06-20 DIAGNOSIS — S4992XA Unspecified injury of left shoulder and upper arm, initial encounter: Secondary | ICD-10-CM | POA: Diagnosis present

## 2015-06-20 NOTE — ED Notes (Addendum)
Pt from home via ems for fall on left side. C/o left arm pain and some intermit CP. Denies LOC. Pt took 4 baby asprin at home . A&O . Hx of MI . No obvious deformity or swelling to LUE.. Pt bp is 188/110

## 2015-06-21 DIAGNOSIS — S46912A Strain of unspecified muscle, fascia and tendon at shoulder and upper arm level, left arm, initial encounter: Secondary | ICD-10-CM | POA: Diagnosis not present

## 2015-06-21 LAB — COMPREHENSIVE METABOLIC PANEL
ALK PHOS: 60 U/L (ref 38–126)
ALT: 26 U/L (ref 17–63)
ANION GAP: 9 (ref 5–15)
AST: 37 U/L (ref 15–41)
Albumin: 3.6 g/dL (ref 3.5–5.0)
BILIRUBIN TOTAL: 0.5 mg/dL (ref 0.3–1.2)
BUN: 13 mg/dL (ref 6–20)
CALCIUM: 9 mg/dL (ref 8.9–10.3)
CO2: 26 mmol/L (ref 22–32)
CREATININE: 1.21 mg/dL (ref 0.61–1.24)
Chloride: 105 mmol/L (ref 101–111)
Glucose, Bld: 118 mg/dL — ABNORMAL HIGH (ref 65–99)
Potassium: 3.1 mmol/L — ABNORMAL LOW (ref 3.5–5.1)
Sodium: 140 mmol/L (ref 135–145)
TOTAL PROTEIN: 7.2 g/dL (ref 6.5–8.1)

## 2015-06-21 LAB — CBC
HEMATOCRIT: 35 % — AB (ref 40.0–52.0)
HEMOGLOBIN: 11.2 g/dL — AB (ref 13.0–18.0)
MCH: 25 pg — AB (ref 26.0–34.0)
MCHC: 32.1 g/dL (ref 32.0–36.0)
MCV: 77.8 fL — AB (ref 80.0–100.0)
Platelets: 226 10*3/uL (ref 150–440)
RBC: 4.5 MIL/uL (ref 4.40–5.90)
RDW: 14.4 % (ref 11.5–14.5)
WBC: 6.5 10*3/uL (ref 3.8–10.6)

## 2015-06-21 LAB — TROPONIN I: Troponin I: <0.03 ng/mL (ref <0.031)

## 2015-06-21 LAB — LIPASE, BLOOD: Lipase: 55 U/L — ABNORMAL HIGH (ref 11–51)

## 2015-06-21 MED ORDER — POTASSIUM CHLORIDE CRYS ER 20 MEQ PO TBCR
40.0000 meq | EXTENDED_RELEASE_TABLET | Freq: Once | ORAL | Status: AC
  Administered 2015-06-21: 40 meq via ORAL
  Filled 2015-06-21: qty 2

## 2015-06-21 NOTE — ED Notes (Signed)
Pt c/o left arm pain after falling out of bed trying to reach for a phone. Pt states he has CP initally but took 4 Asprin at home and has since been relieved. No deformity or swelling noted to left arm. Pt HTN at 186/102.

## 2015-06-21 NOTE — ED Provider Notes (Signed)
[facility] Emergency Department Provider Note  ____________________________________________  Time seen: Approximately 12:06 AM  I have reviewed the triage vital signs and the nursing notes.   HISTORY  Chief Complaint Fall    HPI Sherley Leser Sr. is a 65 y.o. male presents by EMS for evaluation of left arm pain and some brief mild chest pain and shortness of breath after falling out of bed.  He reports that he was asleep and mustard dreamed that his phone was ringing.  He rolled over to get the phone but rolled out of bed and landed on his left arm.  He had acute onset of mild to moderate pain in his upper left arm.  Immediately after the fall he also developed some mild shortness of breath and some generalized chest discomfort that he describes as mild, pressure-like, and has completely resolved.  He took 4 baby aspirin and then called EMS.  He states that he feels completely normal now except for some residual mild discomfort in his left upper arm.  He denies fever/chills, abdominal pain, dysuria.  He did not strike his head and has no headache nor neck pain.  He does not have any pain or injuries to any other extremities.  He is able to fully move his left arm even though it does cause some reproducible discomfort in the proximal humerus.  Movement makes it worse and rest makes it better.  He has a past cardiac history that includes an MI, cardiomegaly, and CHF.  He is followed by Dr. Devonne Doughty at [facility].  Past Medical History  Diagnosis Date  . CHF (congestive heart failure) (HCC)     Dr. Devonne Doughty, [employer]  . Hypertension   . Anginal pain (HCC)   . Cardiomegaly 12/05/2011    /H&P    Patient Active Problem List   Diagnosis Date Noted  . Chest pain, unspecified 12/05/2011  . Nonspecific abnormal electrocardiogram (ECG) (EKG) 12/05/2011  . Essential hypertension, benign 12/05/2011  . Chronic systolic heart failure  (HCC) 69/[id number]/9528    Past Surgical History  Procedure Laterality Date  . No past surgeries      Current Outpatient Rx  Name  Route  Sig  Dispense  Refill  . amLODipine (NORVASC) 5 MG tablet   Oral   Take 5 mg by mouth every evening.         Marland Kitchen aspirin EC 81 MG tablet   Oral   Take 81 mg by mouth daily.         . carvedilol (COREG) 12.5 MG tablet   Oral   Take 12.5 mg by mouth 2 (two) times daily with a meal.         . furosemide (LASIX) 20 MG tablet   Oral   Take 40 mg by mouth daily.          Marland Kitchen lisinopril (PRINIVIL,ZESTRIL) 5 MG tablet   Oral   Take 5 mg by mouth 2 (two) times daily.         . pravastatin (PRAVACHOL) 20 MG tablet   Oral   Take 20 mg by mouth every evening.         . sertraline (ZOLOFT) 25 MG tablet   Oral   Take 25 mg by mouth daily.         Marland Kitchen spironolactone (ALDACTONE) 25 MG tablet   Oral   Take 50 mg by mouth daily.  Allergies Review of patient's allergies indicates no known allergies.  History reviewed. No pertinent family history.  Social History Social History  Substance Use Topics  . Smoking status: Current Some Day Smoker -- 0.10 packs/day    Types: Cigarettes  . Smokeless tobacco: Never Used     Comment: 12/05/2011 States that he smokes 5 cigarettes a week  . Alcohol Use: 3.0 oz/week    5 Cans of beer per week    Review of Systems Constitutional: No fever/chills Eyes: No visual changes. ENT: No sore throat. Cardiovascular: Brief mild generalized chest pressure after falling out of bed now resolved Respiratory: Brief mild shortness of breath after falling out of bed, now resolved Gastrointestinal: No abdominal pain.  No nausea, no vomiting.  No diarrhea.  No constipation. Genitourinary: Negative for dysuria. Musculoskeletal: Acute onset of pain in the proximal humerus of the left arm after falling out of bed, mild Skin: Negative for rash. Neurological: Negative for headaches, focal weakness or  numbness.  10-point ROS otherwise negative.  ____________________________________________   PHYSICAL EXAM:  VITAL SIGNS: ED Triage Vitals  Enc Vitals Group     BP 06/20/15 2349 188/110 mmHg     Pulse Rate 06/20/15 2349 74     Resp 06/20/15 2349 16     Temp --      Temp Source 06/20/15 2349 Oral     SpO2 06/20/15 2349 96 %     Weight --      Height --      Head Cir --      Peak Flow --      Pain Score 06/20/15 2351 8     Pain Loc --      Pain Edu? --      Excl. in GC? --     Constitutional: Alert and oriented. Well appearing and in no acute distress. Eyes: Conjunctivae are normal. PERRL. EOMI. Head: Atraumatic. Nose: No congestion/rhinnorhea. Mouth/Throat: Mucous membranes are moist.  Oropharynx non-erythematous. Neck: No stridor.  No cervical spine tenderness to palpation. Cardiovascular: Normal rate, regular rhythm. Grossly normal heart sounds.  Good peripheral circulation. Respiratory: Normal respiratory effort.  No retractions. Lungs CTAB. Gastrointestinal: Soft and nontender. No distention. No abdominal bruits. No CVA tenderness. Musculoskeletal: Mild tenderness to palpation of the soft tissue in the region of the proximal humerus but no bony tenderness to palpation.  No tenderness to palpation of the shoulder nor clavicle.  Normal range of motion with mild tenderness in the same area.  No gross deformities. Neurologic:  Normal speech and language. No gross focal neurologic deficits are appreciated.  Skin:  Skin is warm, dry and intact. No rash noted. Psychiatric: Mood and affect are normal. Speech and behavior are normal.  ____________________________________________   LABS (all labs ordered are listed, but only abnormal results are displayed)  Labs Reviewed  CBC - Abnormal; Notable for the following:    Hemoglobin 11.2 (*)    HCT 35.0 (*)    MCV 77.8 (*)    MCH 25.0 (*)    All other components within normal limits  COMPREHENSIVE METABOLIC PANEL - Abnormal;  Notable for the following:    Potassium 3.1 (*)    Glucose, Bld 118 (*)    All other components within normal limits  LIPASE, BLOOD - Abnormal; Notable for the following:    Lipase 55 (*)    All other components within normal limits  TROPONIN I   ____________________________________________  EKG  ED ECG REPORT I, Zayvian Mcmurtry, the  attending physician, personally viewed and interpreted this ECG.   Date: 06/21/2015  EKG Time: 23:56  Rate: 72  Rhythm: sinus rhythm with first-degree AV block  Axis: Normal  Intervals:first-degree A-V block  with LVH  ST&T Change: Lateral T-wave inversions most evident in leads V5 and V6 but unchanged from prior EKG approximately 5 months ago  ____________________________________________  RADIOLOGY   No results found.  ____________________________________________   PROCEDURES  Procedure(s) performed: None  Critical Care performed: No ____________________________________________   INITIAL IMPRESSION / ASSESSMENT AND PLAN / ED COURSE  Pertinent labs & imaging results that were available during my care of the patient were reviewed by me and considered in my medical decision making (see chart for details).  The patient had brief shortness of breath and chest discomfort when he was startled from falling out of bed.  This EKG is unchanged from prior and his history is inconsistent with ACS.  I discussed the plan with the patient to check some labs and make sure he has nothing concerning but I believe he is going to be appropriate for discharge and outpatient follow-up.  There is no indication that he has ongoing ACS.  Based on his physical exam there is no indication of injury to his left arm other than a mild contusion and musculoskeletal pain.  There is no bony tenderness to palpation nor limitation of range of motion.  I offered an x-ray to the patient to rule out bony injury but he said that he does not want it somehow mild symptoms are and  I agree that it is not necessary at this time.  After checking labs and making sure there are no acute abnormalities the patient can be discharged for follow-up with his regular doctors.  We had a full dose of ASA prior to arrival.  ----------------------------------------- 1:35 AM on 06/21/2015 -----------------------------------------  The patient's labs are all unremarkable except for some hypokalemia.  Looking back at his labs in the past this is fairly consistent.  I have given him a dose of potassium in the ED and recommended that he discuss it with his cardiologist.  I offered obtaining a second troponin for further rule out, but I explained that I think he is at very low risk for ACS and he has been completely asymptomatic since coming to the emergency department.  He states he prefers to go home to rest.  I observed him using his left (unaffected) arm without any difficulty and he is eating and drinking in the emergency department.  I do not believe he has any emergent medical condition at this time and is appropriate for outpatient follow-up. ____________________________________________  FINAL CLINICAL IMPRESSION(S) / ED DIAGNOSES  Final diagnoses:  Muscle strain of left upper arm, initial encounter  Contusion  Atypical chest pain  Hypokalemia      NEW MEDICATIONS STARTED DURING THIS VISIT:  Discharge Medication List as of 06/21/2015  1:38 AM       Loleta Rose, MD 06/21/15 0401

## 2015-06-21 NOTE — Discharge Instructions (Signed)
As we discussed, we think that falling out of bed and started reviewing caused your brief shortness of breath and chest discomfort, but fortunately your workup today was reassuring.  There is no indication of a bony injury and your left arm or any lab abnormalities.  Please follow-up with your regular doctor within several days.  Return to the emergency department if you develop new or worsening symptoms that concern you.    We recommend that you tell your cardiologist that her potassium was low today and that we gave you an extra dose of potassium.  He should follow up at the next available opportunity for additional outpatient lab work to make sure your potassium level stays within a normal range.   Muscle Strain A muscle strain is an injury that occurs when a muscle is stretched beyond its normal length. Usually a small number of muscle fibers are torn when this happens. Muscle strain is rated in degrees. First-degree strains have the least amount of muscle fiber tearing and pain. Second-degree and third-degree strains have increasingly more tearing and pain.  Usually, recovery from muscle strain takes 1-2 weeks. Complete healing takes 5-6 weeks.  CAUSES  Muscle strain happens when a sudden, violent force placed on a muscle stretches it too far. This may occur with lifting, sports, or a fall.  RISK FACTORS Muscle strain is especially common in athletes.  SIGNS AND SYMPTOMS At the site of the muscle strain, there may be:  Pain.  Bruising.  Swelling.  Difficulty using the muscle due to pain or lack of normal function. DIAGNOSIS  Your health care provider will perform a physical exam and ask about your medical history. TREATMENT  Often, the best treatment for a muscle strain is resting, icing, and applying cold compresses to the injured area.  HOME CARE INSTRUCTIONS   Use the PRICE method of treatment to promote muscle healing during the first 2-3 days after your injury. The PRICE method  involves:  Protecting the muscle from being injured again.  Restricting your activity and resting the injured body part.  Icing your injury. To do this, put ice in a plastic bag. Place a towel between your skin and the bag. Then, apply the ice and leave it on from 15-20 minutes each hour. After the third day, switch to moist heat packs.  Apply compression to the injured area with a splint or elastic bandage. Be careful not to wrap it too tightly. This may interfere with blood circulation or increase swelling.  Elevate the injured body part above the level of your heart as often as you can.  Only take over-the-counter or prescription medicines for pain, discomfort, or fever as directed by your health care provider.  Warming up prior to exercise helps to prevent future muscle strains. SEEK MEDICAL CARE IF:   You have increasing pain or swelling in the injured area.  You have numbness, tingling, or a significant loss of strength in the injured area. MAKE SURE YOU:   Understand these instructions.  Will watch your condition.  Will get help right away if you are not doing well or get worse.   This information is not intended to replace advice given to you by your health care provider. Make sure you discuss any questions you have with your health care provider.   Document Released: 04/16/2005 Document Revised: 02/04/2013 Document Reviewed: 11/13/2012 Elsevier Interactive Patient Education 2016 [employer]  Nonspecific Chest Pain  Chest pain can be caused by many different conditions. There  is always a chance that your pain could be related to something serious, such as a heart attack or a blood clot in your lungs. Chest pain can also be caused by conditions that are not life-threatening. If you have chest pain, it is very important to follow up with your health care provider. CAUSES  Chest pain can be caused by:  Heartburn.  Pneumonia or bronchitis.  Anxiety or  stress.  Inflammation around your heart (pericarditis) or lung (pleuritis or pleurisy).  A blood clot in your lung.  A collapsed lung (pneumothorax). It can develop suddenly on its own (spontaneous pneumothorax) or from trauma to the chest.  Shingles infection (varicella-zoster virus).  Heart attack.  Damage to the bones, muscles, and cartilage that make up your chest wall. This can include:  Bruised bones due to injury.  Strained muscles or cartilage due to frequent or repeated coughing or overwork.  Fracture to one or more ribs.  Sore cartilage due to inflammation (costochondritis). RISK FACTORS  Risk factors for chest pain may include:  Activities that increase your risk for trauma or injury to your chest.  Respiratory infections or conditions that cause frequent coughing.  Medical conditions or overeating that can cause heartburn.  Heart disease or family history of heart disease.  Conditions or health behaviors that increase your risk of developing a blood clot.  Having had chicken pox (varicella zoster). SIGNS AND SYMPTOMS Chest pain can feel like:  Burning or tingling on the surface of your chest or deep in your chest.  Crushing, pressure, aching, or squeezing pain.  Dull or sharp pain that is worse when you move, cough, or take a deep breath.  Pain that is also felt in your back, neck, shoulder, or arm, or pain that spreads to any of these areas. Your chest pain may come and go, or it may stay constant. DIAGNOSIS Lab tests or other studies may be needed to find the cause of your pain. Your health care provider may have you take a test called an ambulatory ECG (electrocardiogram). An ECG records your heartbeat patterns at the time the test is performed. You may also have other tests, such as:  Transthoracic echocardiogram (TTE). During echocardiography, sound waves are used to create a picture of all of the heart structures and to look at how blood flows  through your heart.  Transesophageal echocardiogram (TEE).This is a more advanced imaging test that obtains images from inside your body. It allows your health care provider to see your heart in finer detail.  Cardiac monitoring. This allows your health care provider to monitor your heart rate and rhythm in real time.  Holter monitor. This is a portable device that records your heartbeat and can help to diagnose abnormal heartbeats. It allows your health care provider to track your heart activity for several days, if needed.  Stress tests. These can be done through exercise or by taking medicine that makes your heart beat more quickly.  Blood tests.  Imaging tests. TREATMENT  Your treatment depends on what is causing your chest pain. Treatment may include:  Medicines. These may include:  Acid blockers for heartburn.  Anti-inflammatory medicine.  Pain medicine for inflammatory conditions.  Antibiotic medicine, if an infection is present.  Medicines to dissolve blood clots.  Medicines to treat coronary artery disease.  Supportive care for conditions that do not require medicines. This may include:  Resting.  Applying heat or cold packs to injured areas.  Limiting activities until pain decreases. HOME  CARE INSTRUCTIONS  If you were prescribed an antibiotic medicine, finish it all even if you start to feel better.  Avoid any activities that bring on chest pain.  Do not use any tobacco products, including cigarettes, chewing tobacco, or electronic cigarettes. If you need help quitting, ask your health care provider.  Do not drink alcohol.  Take medicines only as directed by your health care provider.  Keep all follow-up visits as directed by your health care provider. This is important. This includes any further testing if your chest pain does not go away.  If heartburn is the cause for your chest pain, you may be told to keep your head raised (elevated) while sleeping.  This reduces the chance that acid will go from your stomach into your esophagus.  Make lifestyle changes as directed by your health care provider. These may include:  Getting regular exercise. Ask your health care provider to suggest some activities that are safe for you.  Eating a heart-healthy diet. A registered dietitian can help you to learn healthy eating options.  Maintaining a healthy weight.  Managing diabetes, if necessary.  Reducing stress. SEEK MEDICAL CARE IF:  Your chest pain does not go away after treatment.  You have a rash with blisters on your chest.  You have a fever. SEEK IMMEDIATE MEDICAL CARE IF:   Your chest pain is worse.  You have an increasing cough, or you cough up blood.  You have severe abdominal pain.  You have severe weakness.  You faint.  You have chills.  You have sudden, unexplained chest discomfort.  You have sudden, unexplained discomfort in your arms, back, neck, or jaw.  You have shortness of breath at any time.  You suddenly start to sweat, or your skin gets clammy.  You feel nauseous or you vomit.  You suddenly feel light-headed or dizzy.  Your heart begins to beat quickly, or it feels like it is skipping beats. These symptoms may represent a serious problem that is an emergency. Do not wait to see if the symptoms will go away. Get medical help right away. Call your local emergency services (911 in the [place] Do not drive yourself to the hospital.   This information is not intended to replace advice given to you by your health care provider. Make sure you discuss any questions you have with your health care provider.   Document Released: 01/24/2005 Document Revised: 05/07/2014 Document Reviewed: 11/20/2013 Elsevier Interactive Patient Education 2016 [employer].    Hypokalemia Hypokalemia means that the amount of potassium in the blood is lower than normal.Potassium is a chemical, called an electrolyte, that helps  regulate the amount of fluid in the body. It also stimulates muscle contraction and helps nerves function properly.Most of the body's potassium is inside of cells, and only a very small amount is in the blood. Because the amount in the blood is so small, minor changes can be life-threatening. CAUSES  Antibiotics.  Diarrhea or vomiting.  Using laxatives too much, which can cause diarrhea.  Chronic kidney disease.  Water pills (diuretics).  Eating disorders (bulimia).  Low magnesium level.  Sweating a lot. SIGNS AND SYMPTOMS  Weakness.  Constipation.  Fatigue.  Muscle cramps.  Mental confusion.  Skipped heartbeats or irregular heartbeat (palpitations).  Tingling or numbness. DIAGNOSIS  Your health care provider can diagnose hypokalemia with blood tests. In addition to checking your potassium level, your health care provider may also check other lab tests. TREATMENT Hypokalemia can be treated with  potassium supplements taken by mouth or adjustments in your current medicines. If your potassium level is very low, you may need to get potassium through a vein (IV) and be monitored in the hospital. A diet high in potassium is also helpful. Foods high in potassium are:  Nuts, such as peanuts and pistachios.  Seeds, such as sunflower seeds and pumpkin seeds.  Peas, lentils, and lima beans.  Whole grain and bran cereals and breads.  Fresh fruit and vegetables, such as apricots, avocado, bananas, cantaloupe, kiwi, oranges, tomatoes, asparagus, and potatoes.  Orange and tomato juices.  Red meats.  Fruit yogurt. HOME CARE INSTRUCTIONS  Take all medicines as prescribed by your health care provider.  Maintain a healthy diet by including nutritious food, such as fruits, vegetables, nuts, whole grains, and lean meats.  If you are taking a laxative, be sure to follow the directions on the label. SEEK MEDICAL CARE IF:  Your weakness gets worse.  You feel your heart  pounding or racing.  You are vomiting or having diarrhea.  You are diabetic and having trouble keeping your blood glucose in the normal range. SEEK IMMEDIATE MEDICAL CARE IF:  You have chest pain, shortness of breath, or dizziness.  You are vomiting or having diarrhea for more than 2 days.  You faint. MAKE SURE YOU:   Understand these instructions.  Will watch your condition.  Will get help right away if you are not doing well or get worse.   This information is not intended to replace advice given to you by your health care provider. Make sure you discuss any questions you have with your health care provider.   Document Released: 04/16/2005 Document Revised: 05/07/2014 Document Reviewed: 10/17/2012 Elsevier Interactive Patient Education [employer]

## 2015-10-14 ENCOUNTER — Telehealth: Payer: Self-pay | Admitting: Emergency Medicine

## 2015-10-14 ENCOUNTER — Encounter: Payer: Self-pay | Admitting: Urgent Care

## 2015-10-14 ENCOUNTER — Emergency Department: Payer: Medicare Other

## 2015-10-14 ENCOUNTER — Emergency Department
Admission: EM | Admit: 2015-10-14 | Discharge: 2015-10-14 | Disposition: A | Discharge status: Home / Self Care | Payer: Medicare Other | Attending: Emergency Medicine | Admitting: Emergency Medicine

## 2015-10-14 DIAGNOSIS — Z5321 Procedure and treatment not carried out due to patient leaving prior to being seen by health care provider: Secondary | ICD-10-CM | POA: Diagnosis not present

## 2015-10-14 DIAGNOSIS — R0602 Shortness of breath: Secondary | ICD-10-CM | POA: Diagnosis not present

## 2015-10-14 LAB — CBC
HEMATOCRIT: 32.4 % — AB (ref 40.0–52.0)
HEMOGLOBIN: 10.7 g/dL — AB (ref 13.0–18.0)
MCH: 26.1 pg (ref 26.0–34.0)
MCHC: 33.2 g/dL (ref 32.0–36.0)
MCV: 78.5 fL — ABNORMAL LOW (ref 80.0–100.0)
Platelets: 196 10*3/uL (ref 150–440)
RBC: 4.12 MIL/uL — AB (ref 4.40–5.90)
RDW: 16.3 % — ABNORMAL HIGH (ref 11.5–14.5)
WBC: 7 10*3/uL (ref 3.8–10.6)

## 2015-10-14 LAB — BRAIN NATRIURETIC PEPTIDE: B NATRIURETIC PEPTIDE 5: 42 pg/mL (ref 0.0–100.0)

## 2015-10-14 LAB — BASIC METABOLIC PANEL
ANION GAP: 8 (ref 5–15)
BUN: 15 mg/dL (ref 6–20)
CO2: 24 mmol/L (ref 22–32)
Calcium: 9.3 mg/dL (ref 8.9–10.3)
Chloride: 107 mmol/L (ref 101–111)
Creatinine, Ser: 1.32 mg/dL — ABNORMAL HIGH (ref 0.61–1.24)
GFR calc Af Amer: >60 mL/min (ref >60)
GFR, EST NON AFRICAN AMERICAN: 55 mL/min — AB (ref >60)
Glucose, Bld: 112 mg/dL — ABNORMAL HIGH (ref 65–99)
POTASSIUM: 3.2 mmol/L — AB (ref 3.5–5.1)
SODIUM: 139 mmol/L (ref 135–145)

## 2015-10-14 LAB — TROPONIN I: Troponin I: <0.03 ng/mL (ref <0.031)

## 2015-10-14 NOTE — ED Notes (Signed)
Patient presents with c/o SOB tonight. Started while in bed. Reports marked orthopnea. Patient in NAD at this time. Able to speak in complete sentences; no increased WOB noted.

## 2015-10-14 NOTE — ED Notes (Signed)
Called patient due to lwot to inquire about condition and follow up plans. Pt says he is going to call his doctor.  i told him to tell his pcp his symptoms and that he has labs and xray results from his visit here.  He agrees.

## 2015-11-27 ENCOUNTER — Encounter: Payer: Self-pay | Admitting: Emergency Medicine

## 2015-11-27 ENCOUNTER — Emergency Department
Admission: EM | Admit: 2015-11-27 | Discharge: 2015-11-27 | Disposition: A | Discharge status: Home / Self Care | Payer: Medicare Other | Attending: Emergency Medicine | Admitting: Emergency Medicine

## 2015-11-27 DIAGNOSIS — F17210 Nicotine dependence, cigarettes, uncomplicated: Secondary | ICD-10-CM | POA: Insufficient documentation

## 2015-11-27 DIAGNOSIS — Z79899 Other long term (current) drug therapy: Secondary | ICD-10-CM | POA: Insufficient documentation

## 2015-11-27 DIAGNOSIS — I110 Hypertensive heart disease with heart failure: Secondary | ICD-10-CM | POA: Diagnosis not present

## 2015-11-27 DIAGNOSIS — M10071 Idiopathic gout, right ankle and foot: Secondary | ICD-10-CM | POA: Insufficient documentation

## 2015-11-27 DIAGNOSIS — T783XXA Angioneurotic edema, initial encounter: Secondary | ICD-10-CM | POA: Insufficient documentation

## 2015-11-27 DIAGNOSIS — Z7982 Long term (current) use of aspirin: Secondary | ICD-10-CM | POA: Diagnosis not present

## 2015-11-27 DIAGNOSIS — I5022 Chronic systolic (congestive) heart failure: Secondary | ICD-10-CM | POA: Insufficient documentation

## 2015-11-27 MED ORDER — DIPHENHYDRAMINE HCL 50 MG/ML IJ SOLN
50.0000 mg | Freq: Once | INTRAMUSCULAR | Status: AC
  Administered 2015-11-27: 50 mg via INTRAVENOUS
  Filled 2015-11-27: qty 1

## 2015-11-27 MED ORDER — PREDNISONE 20 MG PO TABS: 20.0000 mg | ORAL_TABLET | Freq: Two times a day (BID) | ORAL | 0 refills | Status: AC

## 2015-11-27 MED ORDER — METHYLPREDNISOLONE SODIUM SUCC 125 MG IJ SOLR
125.0000 mg | Freq: Once | INTRAMUSCULAR | Status: AC
  Administered 2015-11-27: 125 mg via INTRAVENOUS
  Filled 2015-11-27: qty 2

## 2015-11-27 NOTE — ED Triage Notes (Signed)
Pt presents to ED c/o gout flare to right big toe and swollen lips that started this morning around 4 am. Pt states he had not eaten or drank anything prior to this starting. Maintains secretions and airway.

## 2015-11-27 NOTE — ED Notes (Signed)
Patient denies pain and is resting comfortably.  

## 2015-11-27 NOTE — ED Provider Notes (Signed)
[facility] Emergency Department Provider Note   ____________________________________________    I have reviewed the triage vital signs and the nursing notes.   HISTORY  Chief Complaint Gout and Angioedema (lips)     HPI Jesse Skillings Sr. is a 65 y.o. male who presents with swelling of his lips. Patient reports he noticed swelling of his lower lip this morning when he woke up. He reports it gradually became worse and he had swelling of his upper lip as well. He denies difficulty swallowing. No difficulty breathing. He reports it is not worsened during his time in the waiting room. He has never had this before. He wonders if it could be caused by medication he is taking for his gout. He does take lisinopril for blood pressure.   Past Medical History:  Diagnosis Date  . Anginal pain (HCC)   . Cardiomegaly 12/05/2011   /H&P  . CHF (congestive heart failure) (HCC)    Dr. Devonne Doughty, [facility]  . Hypertension     Patient Active Problem List   Diagnosis Date Noted  . Chest pain, unspecified 12/05/2011  . Nonspecific abnormal electrocardiogram (ECG) (EKG) 12/05/2011  . Essential hypertension, benign 12/05/2011  . Chronic systolic heart failure (HCC) 12/05/2011    Past Surgical History:  Procedure Laterality Date  . NO PAST SURGERIES      Prior to Admission medications   Medication Sig Start Date End Date Taking? Authorizing Provider  amLODipine (NORVASC) 5 MG tablet Take 5 mg by mouth every evening.    Historical Provider, MD  aspirin EC 81 MG tablet Take 81 mg by mouth daily.    Historical Provider, MD  carvedilol (COREG) 12.5 MG tablet Take 12.5 mg by mouth 2 (two) times daily with a meal.    Historical Provider, MD  furosemide (LASIX) 20 MG tablet Take 40 mg by mouth daily.     Historical Provider, MD  lisinopril (PRINIVIL,ZESTRIL) 5 MG tablet Take 5 mg by mouth 2 (two) times daily.    Historical Provider, MD    pravastatin (PRAVACHOL) 20 MG tablet Take 20 mg by mouth every evening.    Historical Provider, MD  sertraline (ZOLOFT) 25 MG tablet Take 25 mg by mouth daily.    Historical Provider, MD  spironolactone (ALDACTONE) 25 MG tablet Take 50 mg by mouth daily.     Historical Provider, MD  .   Allergies Review of patient's allergies indicates no known allergies.  History reviewed. No pertinent family history.  Social History Social History  Substance Use Topics  . Smoking status: Current Some Day Smoker    Packs/day: 0.10    Types: Cigarettes  . Smokeless tobacco: Never Used     Comment: 12/05/2011 States that he smokes 5 cigarettes a week  . Alcohol use 3.0 oz/week    5 Cans of beer per week    Review of Systems  Constitutional: No Dizziness Eyes: No visual changes.  ENT: No sore throat or intraoral swelling Cardiovascular: Denies chest pain. Respiratory: Denies shortness of breath. Gastrointestinal: No nausea, no vomiting.   Genitourinary: Negative for dysuria. Musculoskeletal: Negative for joint swelling Skin: Negative for hives Neurological: Negative for headaches or weakness  10-point ROS otherwise negative.  ____________________________________________   PHYSICAL EXAM:  VITAL SIGNS: ED Triage Vitals  Enc Vitals Group     BP 11/27/15 1145 125/78     Pulse Rate 11/27/15 1145 78     Resp 11/27/15 1145 18  Temp 11/27/15 1145 97.4 F (36.3 C)     Temp Source 11/27/15 1145 Oral     SpO2 11/27/15 1145 100 %     Weight 11/27/15 1145 183 lb (83 kg)     Height 11/27/15 1145  (1.676 m)     Head Circumference --      Peak Flow --      Pain Score 11/27/15 1402 0     Pain Loc --      Pain Edu? --      Excl. in GC? --     Constitutional: Alert and oriented. No acute distress. Pleasant and interactive Eyes: Conjunctivae are normal.  Head: Atraumatic. Nose: No congestion/rhinnorhea. Mouth/Throat: Mucous membranes are moist.  Swelling to the upper and lower  lips, lower greater than upper. No intraoral swelling or edema noted. No pharyngeal swelling or edema noted  Cardiovascular: Normal rate, regular rhythm. Grossly normal heart sounds.  Good peripheral circulation. Respiratory: Normal respiratory effort.  No retractions. Lungs CTAB. Gastrointestinal: Soft and nontender. No distention.   Genitourinary: deferred Musculoskeletal: No lower extremity tenderness nor edema.  Warm and well perfused Neurologic:  Normal speech and language. No gross focal neurologic deficits are appreciated.  Skin:  Skin is warm, dry and intact. No rash noted. Psychiatric: Mood and affect are normal. Speech and behavior are normal.  ____________________________________________   LABS (all labs ordered are listed, but only abnormal results are displayed)  Labs Reviewed - No data to display ____________________________________________  EKG  None ____________________________________________  RADIOLOGY  None ____________________________________________   PROCEDURES  Procedure(s) performed: No    Critical Care performed: No ____________________________________________   INITIAL IMPRESSION / ASSESSMENT AND PLAN / ED COURSE  Pertinent labs & imaging results that were available during my care of the patient were reviewed by me and considered in my medical decision making (see chart for details).  Patient presents with lip swelling. 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 Discussed with the patient the need to stop lisinopril and discuss with his doctor a  different blood pressure medication. Patient's swelling does not appear to be worsening however we will observe in the emergency department. We will give solu Medrol and Benadryl  Clinical Course  Anticipate prolonged ED stay, I will ask my colleague to discharge the patient if no worsening of symptoms ____________________________________________   FINAL CLINICAL IMPRESSION(S) / ED  DIAGNOSES  Final diagnoses:  Angioedema, initial encounter      NEW MEDICATIONS STARTED DURING THIS VISIT:  New Prescriptions   No medications on file     Note:  This document was prepared using Dragon voice recognition software and may include unintentional dictation errors.    Jene Every, MD 11/27/15 [phone]

## 2015-11-27 NOTE — ED Provider Notes (Signed)
-----------------------------------------   4:20 PM on 11/27/2015 -----------------------------------------   Blood pressure 118/77, pulse 62, temperature 97.4 F (36.3 C), temperature source Oral, resp. rate 18, height 5\' 6"  (1.676 m), weight 183 lb (83 kg), SpO2 97 %.  Assuming care from Dr. Cyril Loosen.  In short, Jesse Galvan. is a 65 y.o. male with a chief complaint of Gout and Angioedema (lips) .  Refer to the original H&P for additional details.  The current plan of care is to monitor the patient after receiving Benadryl and steroid therapy for angioedema. Patient feels symptomatically improved and was reexamined approximately one hour after receiving medication. He appears stable and is of understanding that he will need to stop the lisinopril and review his blood pressure medications with his primary physician to see if there needs to be a change at this time. He will no longer be able to be on the lisinopril, and may require a substitute. His reexamination shows a patent airway with only swelling on the lips without any involvement of the posterior Fahrenheit. He has no stridor, normal speech, etc.    Jennye Moccasin, MD 11/27/15 [phone]

## 2015-11-27 NOTE — ED Notes (Signed)
Consulted Dr. Cyril Loosen for orders; no new orders at this time.

## 2015-11-27 NOTE — Discharge Instructions (Signed)
Please return immediately if condition worsens. Please contact her primary physician or the physician you were given for referral. If you have any specialist physicians involved in her treatment and plan please also contact them. Thank you for using [facility]. Please hold the lisinopril and speak to your primary physician concerning a change in your blood pressure medications

## 2016-01-27 ENCOUNTER — Emergency Department
Admission: EM | Admit: 2016-01-27 | Discharge: 2016-01-27 | Disposition: A | Discharge status: Home / Self Care | Payer: Medicare Other | Attending: Emergency Medicine | Admitting: Emergency Medicine

## 2016-01-27 DIAGNOSIS — Y929 Unspecified place or not applicable: Secondary | ICD-10-CM | POA: Insufficient documentation

## 2016-01-27 DIAGNOSIS — Z5321 Procedure and treatment not carried out due to patient leaving prior to being seen by health care provider: Secondary | ICD-10-CM | POA: Diagnosis not present

## 2016-01-27 DIAGNOSIS — S61219A Laceration without foreign body of unspecified finger without damage to nail, initial encounter: Secondary | ICD-10-CM | POA: Insufficient documentation

## 2016-01-27 DIAGNOSIS — F17210 Nicotine dependence, cigarettes, uncomplicated: Secondary | ICD-10-CM | POA: Diagnosis not present

## 2016-01-27 DIAGNOSIS — I509 Heart failure, unspecified: Secondary | ICD-10-CM | POA: Diagnosis not present

## 2016-01-27 DIAGNOSIS — X58XXXA Exposure to other specified factors, initial encounter: Secondary | ICD-10-CM | POA: Insufficient documentation

## 2016-01-27 DIAGNOSIS — Y999 Unspecified external cause status: Secondary | ICD-10-CM | POA: Insufficient documentation

## 2016-01-27 DIAGNOSIS — Y939 Activity, unspecified: Secondary | ICD-10-CM | POA: Diagnosis not present

## 2016-01-27 DIAGNOSIS — I110 Hypertensive heart disease with heart failure: Secondary | ICD-10-CM | POA: Diagnosis not present

## 2016-01-27 DIAGNOSIS — Z7982 Long term (current) use of aspirin: Secondary | ICD-10-CM | POA: Insufficient documentation

## 2016-04-30 HISTORY — PX: COLONOSCOPY: SHX174

## 2016-05-03 ENCOUNTER — Emergency Department: Payer: Medicare Other

## 2016-05-03 ENCOUNTER — Emergency Department
Admission: EM | Admit: 2016-05-03 | Discharge: 2016-05-04 | Disposition: A | Discharge status: Home / Self Care | Payer: Medicare Other | Attending: Emergency Medicine | Admitting: Emergency Medicine

## 2016-05-03 DIAGNOSIS — F17210 Nicotine dependence, cigarettes, uncomplicated: Secondary | ICD-10-CM | POA: Insufficient documentation

## 2016-05-03 DIAGNOSIS — Z7982 Long term (current) use of aspirin: Secondary | ICD-10-CM | POA: Insufficient documentation

## 2016-05-03 DIAGNOSIS — Z79899 Other long term (current) drug therapy: Secondary | ICD-10-CM | POA: Diagnosis not present

## 2016-05-03 DIAGNOSIS — I5022 Chronic systolic (congestive) heart failure: Secondary | ICD-10-CM | POA: Diagnosis not present

## 2016-05-03 DIAGNOSIS — I110 Hypertensive heart disease with heart failure: Secondary | ICD-10-CM | POA: Diagnosis not present

## 2016-05-03 DIAGNOSIS — R079 Chest pain, unspecified: Secondary | ICD-10-CM | POA: Diagnosis present

## 2016-05-03 LAB — COMPREHENSIVE METABOLIC PANEL
ALT: 17 U/L (ref 17–63)
AST: 28 U/L (ref 15–41)
Albumin: 3.9 g/dL (ref 3.5–5.0)
Alkaline Phosphatase: 68 U/L (ref 38–126)
Anion gap: 8 (ref 5–15)
BUN: 12 mg/dL (ref 6–20)
CO2: 28 mmol/L (ref 22–32)
Calcium: 8.9 mg/dL (ref 8.9–10.3)
Chloride: 103 mmol/L (ref 101–111)
Creatinine, Ser: 1.23 mg/dL (ref 0.61–1.24)
GFR calc Af Amer: >60 mL/min (ref >60)
GFR calc non Af Amer: 59 mL/min — ABNORMAL LOW (ref >60)
Glucose, Bld: 112 mg/dL — ABNORMAL HIGH (ref 65–99)
Potassium: 2.9 mmol/L — ABNORMAL LOW (ref 3.5–5.1)
Sodium: 139 mmol/L (ref 135–145)
Total Bilirubin: 0.5 mg/dL (ref 0.3–1.2)
Total Protein: 7.6 g/dL (ref 6.5–8.1)

## 2016-05-03 LAB — CBC
HCT: 39.8 % — ABNORMAL LOW (ref 40.0–52.0)
Hemoglobin: 12.9 g/dL — ABNORMAL LOW (ref 13.0–18.0)
MCH: 24.7 pg — AB (ref 26.0–34.0)
MCHC: 32.3 g/dL (ref 32.0–36.0)
MCV: 76.4 fL — ABNORMAL LOW (ref 80.0–100.0)
PLATELETS: 265 10*3/uL (ref 150–440)
RBC: 5.21 MIL/uL (ref 4.40–5.90)
RDW: 14.3 % (ref 11.5–14.5)
WBC: 7.5 10*3/uL (ref 3.8–10.6)

## 2016-05-03 LAB — TROPONIN I: Troponin I: <0.03 ng/mL (ref <0.03)

## 2016-05-03 MED ORDER — POTASSIUM CHLORIDE 20 MEQ PO PACK
PACK | ORAL | Status: AC
  Administered 2016-05-03: 40 meq via ORAL
  Filled 2016-05-03: qty 2

## 2016-05-03 MED ORDER — POTASSIUM CHLORIDE 20 MEQ PO PACK
40.0000 meq | PACK | Freq: Once | ORAL | Status: AC
  Administered 2016-05-03: 40 meq via ORAL

## 2016-05-03 NOTE — ED Triage Notes (Signed)
Pt bib EMS w/ c/o CP episode that happened today.  Pt sts that CP started after being served search warrant for car by [employer].  Pt alert and oriented, denies CP at this time.  Denies SOB, n/v/d, dizziness or LOC.  NAD.  Pt has hx of MI.  CBG 195. 324 ASA given EMS.
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
## 2016-05-03 NOTE — ED Notes (Signed)
ED Provider at bedside. 

## 2016-05-03 NOTE — ED Notes (Addendum)
12.5 mg carvedilol  160 mg valsartan  Given from pts home meds per verbal order MD Manson PasseyBrown

## 2016-05-03 NOTE — ED Notes (Signed)
Hooked pt up to monitor.

## 2016-05-04 NOTE — ED Notes (Addendum)
Called [place] Galvan for cab and left message.  Pt given Jesse Galvan number and instructed to call from lobby, pt sts he can pay for cab

## 2016-12-03 ENCOUNTER — Encounter: Payer: Self-pay | Admitting: Medical Oncology

## 2016-12-03 ENCOUNTER — Emergency Department
Admission: EM | Admit: 2016-12-03 | Discharge: 2016-12-03 | Disposition: A | Discharge status: Home / Self Care | Payer: Medicare Other | Attending: Emergency Medicine | Admitting: Emergency Medicine

## 2016-12-03 DIAGNOSIS — L02212 Cutaneous abscess of back [any part, except buttock]: Secondary | ICD-10-CM | POA: Diagnosis not present

## 2016-12-03 DIAGNOSIS — L0291 Cutaneous abscess, unspecified: Secondary | ICD-10-CM

## 2016-12-03 DIAGNOSIS — F17210 Nicotine dependence, cigarettes, uncomplicated: Secondary | ICD-10-CM | POA: Insufficient documentation

## 2016-12-03 DIAGNOSIS — Z7902 Long term (current) use of antithrombotics/antiplatelets: Secondary | ICD-10-CM | POA: Diagnosis not present

## 2016-12-03 DIAGNOSIS — I110 Hypertensive heart disease with heart failure: Secondary | ICD-10-CM | POA: Diagnosis not present

## 2016-12-03 DIAGNOSIS — Z79899 Other long term (current) drug therapy: Secondary | ICD-10-CM | POA: Insufficient documentation

## 2016-12-03 DIAGNOSIS — I5022 Chronic systolic (congestive) heart failure: Secondary | ICD-10-CM | POA: Insufficient documentation

## 2016-12-03 DIAGNOSIS — Z7982 Long term (current) use of aspirin: Secondary | ICD-10-CM | POA: Insufficient documentation

## 2016-12-03 MED ORDER — IBUPROFEN 600 MG PO TABS
600.0000 mg | ORAL_TABLET | Freq: Once | ORAL | Status: AC
  Administered 2016-12-03: 600 mg via ORAL
  Filled 2016-12-03: qty 1

## 2016-12-03 MED ORDER — CLINDAMYCIN PHOSPHATE 600 MG/4ML IJ SOLN
600.0000 mg | Freq: Once | INTRAMUSCULAR | Status: AC
  Administered 2016-12-03: 600 mg via INTRAMUSCULAR
  Filled 2016-12-03: qty 4

## 2016-12-03 MED ORDER — CLINDAMYCIN HCL 300 MG PO CAPS: 300.0000 mg | ORAL_CAPSULE | Freq: Three times a day (TID) | ORAL | 0 refills | Status: AC

## 2016-12-03 NOTE — ED Provider Notes (Signed)
[facility] Emergency Department Provider Note  ____________________________________________  Time seen: Approximately 9:00 AM  I have reviewed the triage vital signs and the nursing notes.   HISTORY  Chief Complaint Abscess    HPI Jesse Mckesson Sr. is a 66 y.o. male that presents to the emergency department with back abscess for 2 weeks. He noticed some drainage this morning. Patient went to [facility] yesterday and left due to the wait. He had this same abscess 2 months ago and was drained and told to make a follow-up appointment with plastic surgery to have cyst excised. He has an appointment mild with plastic surgery tomorrow. He denies fever, chills, SOB, CP, nausea, vomiting.    Past Medical History:  Diagnosis Date  . Anginal pain (HCC)   . Cardiomegaly 12/05/2011   /H&P  . CHF (congestive heart failure) (HCC)    Dr. Devonne Doughty, [employer]  . Hypertension     Patient Active Problem List   Diagnosis Date Noted  . Chest pain, unspecified 12/05/2011  . Nonspecific abnormal electrocardiogram (ECG) (EKG) 12/05/2011  . Essential hypertension, benign 12/05/2011  . Chronic systolic heart failure (HCC) 12/05/2011    Past Surgical History:  Procedure Laterality Date  . NO PAST SURGERIES      Prior to Admission medications   Medication Sig Start Date End Date Taking? Authorizing Provider  amLODipine (NORVASC) 5 MG tablet Take 5 mg by mouth every evening.    [provider]  aspirin EC 81 MG tablet Take 81 mg by mouth daily.    [provider]  carvedilol (COREG) 12.5 MG tablet Take 12.5 mg by mouth 2 (two) times daily with a meal.    [provider]  clindamycin (CLEOCIN) 300 MG capsule Take 1 capsule (300 mg total) by mouth 3 (three) times daily. 12/03/16 12/13/16  Enid Derry, PA-C  furosemide (LASIX) 20 MG tablet Take 40 mg by mouth daily.     [provider]  pravastatin (PRAVACHOL) 20 MG tablet  Take 20 mg by mouth every evening.    [provider]  sertraline (ZOLOFT) 25 MG tablet Take 25 mg by mouth daily.    [provider]  spironolactone (ALDACTONE) 25 MG tablet Take 50 mg by mouth daily.     [provider]    Allergies Lisinopril  No family history on file.  Social History Social History  Substance Use Topics  . Smoking status: Current Some Day Smoker    Packs/day: 0.10    Types: Cigarettes  . Smokeless tobacco: Never Used     Comment: 12/05/2011 States that he smokes 5 cigarettes a week  . Alcohol use 3.0 oz/week    5 Cans of beer per week     Review of Systems  Constitutional: No fever/chills Cardiovascular: No chest pain. Respiratory: No SOB. Gastrointestinal: No abdominal pain.  No nausea, no vomiting.  Skin: Negative for abrasions, lacerations, ecchymosis. Neurological: Negative for headaches, numbness or tingling   ____________________________________________   PHYSICAL EXAM:  VITAL SIGNS: ED Triage Vitals [12/03/16 0823]  Enc Vitals Group     BP (!) 160/82     Pulse Rate 65     Resp 17     Temp 98.3 F (36.8 C)     Temp Source Oral     SpO2 99 %     Weight 182 lb (82.6 kg)     Height 6' (1.829 m)     Head Circumference  Peak Flow      Pain Score 7     Pain Loc      Pain Edu?      Excl. in GC?      Constitutional: Alert and oriented. Well appearing and in no acute distress. Eyes: Conjunctivae are normal. PERRL. EOMI. Head: Atraumatic. ENT:      Ears:      Nose: No congestion/rhinnorhea.      Mouth/Throat: Mucous membranes are moist.  Neck: No stridor.  Cardiovascular: Normal rate, regular rhythm.  Good peripheral circulation. Respiratory: Normal respiratory effort without tachypnea or retractions. Lungs CTAB. Good air entry to the bases with no decreased Musculoskeletal: Full range of motion to all extremities. No gross deformities appreciated. Neurologic:  Normal speech and language. No gross  focal neurologic deficits are appreciated.  Skin:  Skin is warm, dry and intact. 2 cm x 2 cm area of fluctuance with overlying erythema to upper right back with 2 central pores. Psychiatric: Mood and affect are normal. Speech and behavior are normal. Patient exhibits appropriate insight and judgement.   ____________________________________________   LABS (all labs ordered are listed, but only abnormal results are displayed)  Labs Reviewed - No data to display ____________________________________________  EKG   ____________________________________________  RADIOLOGY  No results found.  ____________________________________________    PROCEDURES  Procedure(s) performed:    Procedures    Medications  clindamycin (CLEOCIN) injection 600 mg (600 mg Intramuscular Given 12/03/16 1008)  ibuprofen (ADVIL,MOTRIN) tablet 600 mg (600 mg Oral Given 12/03/16 1003)     ____________________________________________   INITIAL IMPRESSION / ASSESSMENT AND PLAN / ED COURSE  Pertinent labs & imaging results that were available during my care of the patient were reviewed by me and considered in my medical decision making (see chart for details).  Review of the [place] CSRS was performed in accordance of the [facility] prior to dispensing any controlled drugs.     Patient's diagnosis is consistent with abscess. Vital signs and exam are reassuring. No systemic symptoms. Abscess started draining spontaneously while in the emergency department from both central pores. I am clindamycin was given. Patient has an appointment with plastic surgery regarding abscess tomorrow. Patient will be discharged home with prescriptions for clindamycin.  Patient is given ED precautions to return to the ED for any worsening or new symptoms.     ____________________________________________  FINAL CLINICAL IMPRESSION(S) / ED DIAGNOSES  Final diagnoses:  Abscess      NEW MEDICATIONS STARTED DURING THIS  VISIT:  Discharge Medication List as of 12/03/2016 10:13 AM    START taking these medications   Details  clindamycin (CLEOCIN) 300 MG capsule Take 1 capsule (300 mg total) by mouth 3 (three) times daily., Starting Mon 12/03/2016, Until Thu 12/13/2016, Print            This chart was dictated using voice recognition software/Dragon. Despite best efforts to proofread, errors can occur which can change the meaning. Any change was purely unintentional.    Enid DerryWagner, Robbert Langlinais, PA-C 12/03/16 1232    Minna AntisPaduchowski, Kevin, MD 12/03/16 1524

## 2016-12-03 NOTE — ED Triage Notes (Signed)
Pt reports abscess to his back that popped this am.

## 2017-05-20 ENCOUNTER — Emergency Department
Admission: EM | Admit: 2017-05-20 | Discharge: 2017-05-20 | Disposition: A | Discharge status: Home / Self Care | Payer: Medicare Other | Attending: Emergency Medicine | Admitting: Emergency Medicine

## 2017-05-20 ENCOUNTER — Other Ambulatory Visit: Payer: Self-pay

## 2017-05-20 ENCOUNTER — Emergency Department: Payer: Medicare Other

## 2017-05-20 DIAGNOSIS — I5022 Chronic systolic (congestive) heart failure: Secondary | ICD-10-CM | POA: Diagnosis not present

## 2017-05-20 DIAGNOSIS — R079 Chest pain, unspecified: Secondary | ICD-10-CM | POA: Insufficient documentation

## 2017-05-20 DIAGNOSIS — I110 Hypertensive heart disease with heart failure: Secondary | ICD-10-CM | POA: Insufficient documentation

## 2017-05-20 DIAGNOSIS — F17210 Nicotine dependence, cigarettes, uncomplicated: Secondary | ICD-10-CM | POA: Diagnosis not present

## 2017-05-20 DIAGNOSIS — Z7982 Long term (current) use of aspirin: Secondary | ICD-10-CM | POA: Diagnosis not present

## 2017-05-20 DIAGNOSIS — Z79899 Other long term (current) drug therapy: Secondary | ICD-10-CM | POA: Diagnosis not present

## 2017-05-20 LAB — BASIC METABOLIC PANEL
Anion gap: 9 (ref 5–15)
BUN: 13 mg/dL (ref 6–20)
CO2: 24 mmol/L (ref 22–32)
CREATININE: 1.07 mg/dL (ref 0.61–1.24)
Calcium: 8.8 mg/dL — ABNORMAL LOW (ref 8.9–10.3)
Chloride: 104 mmol/L (ref 101–111)
GFR calc non Af Amer: >60 mL/min (ref >60)
Glucose, Bld: 156 mg/dL — ABNORMAL HIGH (ref 65–99)
POTASSIUM: 3.5 mmol/L (ref 3.5–5.1)
SODIUM: 137 mmol/L (ref 135–145)

## 2017-05-20 LAB — TROPONIN I: Troponin I: <0.03 ng/mL (ref <0.03)

## 2017-05-20 LAB — CBC
HCT: 38 % — ABNORMAL LOW (ref 40.0–52.0)
Hemoglobin: 12 g/dL — ABNORMAL LOW (ref 13.0–18.0)
MCH: 24.8 pg — ABNORMAL LOW (ref 26.0–34.0)
MCHC: 31.7 g/dL — ABNORMAL LOW (ref 32.0–36.0)
MCV: 78.4 fL — ABNORMAL LOW (ref 80.0–100.0)
PLATELETS: 212 10*3/uL (ref 150–440)
RBC: 4.85 MIL/uL (ref 4.40–5.90)
RDW: 14.8 % — ABNORMAL HIGH (ref 11.5–14.5)
WBC: 5.2 10*3/uL (ref 3.8–10.6)

## 2017-05-20 NOTE — ED Notes (Signed)
Pt states the chest pain lasted approximately 30 minutes. non radiating pain with SOB and nausea.. Denies any pain at present.

## 2017-05-20 NOTE — ED Provider Notes (Addendum)
[facility] Emergency Department Provider Note   ____________________________________________   First MD Initiated Contact with Patient 05/20/17 [id number]     (approximate)  I have reviewed the triage vital signs and the nursing notes.   HISTORY  Chief Complaint Chest Pain    HPI Jesse FavaGarland Cragle Sr. is a 67 y.o. male Patient reports he had about half an hour of sharp chest pain in the middle of his chest with a little bit of shortness of breath, sweating and some nausea he has not having any pain now is not having other any other problems now.   Past Medical History:  Diagnosis Date  . Anginal pain (HCC)   . Cardiomegaly 12/05/2011   /H&P  . CHF (congestive heart failure) (HCC)    Dr. Devonne DoughtyKirkwood Adams, [employer]  . Hypertension     Patient Active Problem List   Diagnosis Date Noted  . Chest pain, unspecified 12/05/2011  . Nonspecific abnormal electrocardiogram (ECG) (EKG) 12/05/2011  . Essential hypertension, benign 12/05/2011  . Chronic systolic heart failure (HCC) 12/05/2011    Past Surgical History:  Procedure Laterality Date  . NO PAST SURGERIES      Prior to Admission medications   Medication Sig Start Date End Date Taking? Authorizing Provider  aspirin EC 81 MG tablet Take 81 mg by mouth daily.   Yes [provider]  carvedilol (COREG) 12.5 MG tablet Take 12.5 mg by mouth 2 (two) times daily with a meal.   Yes [provider]  doxycycline (VIBRA-TABS) 100 MG tablet Take 1 tablet by mouth 2 (two) times daily. 05/14/17  Yes [provider]  eplerenone (INSPRA) 50 MG tablet Take 1 tablet by mouth daily. 02/21/17  Yes [provider]  furosemide (LASIX) 20 MG tablet Take 20 mg by mouth daily.    Yes [provider]  pravastatin (PRAVACHOL) 20 MG tablet Take 20 mg by mouth every evening.   Yes [provider]  sertraline (ZOLOFT) 25 MG tablet Take 25 mg by mouth daily.   Yes  [provider]  spironolactone (ALDACTONE) 25 MG tablet Take 50 mg by mouth daily.    Yes [provider]    Allergies Lisinopril  No family history on file.  Social History Social History   Tobacco Use  . Smoking status: Current Some Day Smoker    Packs/day: 0.10    Types: Cigarettes  . Smokeless tobacco: Never Used  . Tobacco comment: 12/05/2011 States that he smokes 5 cigarettes a week  Substance Use Topics  . Alcohol use: Yes    Alcohol/week: 3.0 oz    Types: 5 Cans of beer per week  . Drug use: No    Review of Systems  Constitutional: No fever/chills Eyes: No visual changes. ENT: No sore throat. Cardiovascular: Denies chest painat present Respiratory: Denies shortness of breathat present. Gastrointestinal: No abdominal pain.  No nauseaat present, no vomiting.  No diarrhea.  No constipation. Genitourinary: Negative for dysuria. Musculoskeletal: Negative for back pain. Skin: Negative for rash. Neurological: Negative for headaches, focal weakness   ____________________________________________   PHYSICAL EXAM:  VITAL SIGNS: ED Triage Vitals  Enc Vitals Group     BP 05/20/17 0726 (!) 172/97     Pulse Rate 05/20/17 0726 64     Resp 05/20/17 0726 17     Temp 05/20/17 0726 (!) 97.2 F (36.2 C)     Temp Source 05/20/17 0726 Oral     SpO2 05/20/17 0726  98 %     Weight 05/20/17 0727 180 lb (81.6 kg)     Height 05/20/17 0727 5\' 9"  (1.753 m)     Head Circumference --      Peak Flow --      Pain Score 05/20/17 0726 0     Pain Loc --      Pain Edu? --      Excl. in GC? --     Constitutional: Alert and oriented. Well appearing and in no acute distress. Eyes: Conjunctivae are normal.  Head: Atraumatic. Nose: No congestion/rhinnorhea. Mouth/Throat: Mucous membranes are moist.  Oropharynx non-erythematous. Neck: No stridor. Cardiovascular: Normal rate, regular rhythm. Grossly normal heart sounds.  Good peripheral circulation. Respiratory:  Normal respiratory effort.  No retractions. Lungs CTAB. Gastrointestinal: Soft and nontender. No distention. No abdominal bruits. No CVA tenderness. Musculoskeletal: No lower extremity tenderness nor edema.  No joint effusions. Neurologic:  Normal speech and language. No gross focal neurologic deficits are appreciated.  Skin:  Skin is warm, dry and intact. No rash noted. Psychiatric: Mood and affect are normal. Speech and behavior are normal.  ____________________________________________   LABS (all labs ordered are listed, but only abnormal results are displayed)  Labs Reviewed  BASIC METABOLIC PANEL - Abnormal; Notable for the following components:      Result Value   Glucose, Bld 156 (*)    Calcium 8.8 (*)    All other components within normal limits  CBC - Abnormal; Notable for the following components:   Hemoglobin 12.0 (*)    HCT 38.0 (*)    MCV 78.4 (*)    MCH 24.8 (*)    MCHC 31.7 (*)    RDW 14.8 (*)    All other components within normal limits  TROPONIN I  TROPONIN I   ____________________________________________  EKG  EKG read and interpreted by me shows normal sinus rhythm rate of 64 normal axis slight improvement and T-wave inversions from 05/03/2017 ____________________________________________  RADIOLOGY  Dg Chest 2 View  Result Date: 05/20/2017 CLINICAL DATA:  Intermittent chest pain. EXAM: CHEST  2 VIEW COMPARISON:  Chest x-ray dated May 03, 2016. FINDINGS: The cardiomediastinal silhouette is mildly enlarged. Normal pulmonary vascularity. Subsegmental atelectasis in the right lower lobe. No focal consolidation, pleural effusion, or pneumothorax. No acute osseous abnormality. Scattered punctate metallic foreign bodies are again noted about the chest soft tissues. IMPRESSION: Mild cardiomegaly.  No active cardiopulmonary disease. Electronically Signed   By: Obie Dredge M.D.   On: 05/20/2017 07:57   chest x-ray read as mild cardiomegaly  only ____________________________________________   PROCEDURES  Procedure(s) performed:   Procedures  Critical Care performed:   ____________________________________________   INITIAL IMPRESSION / ASSESSMENT AND PLAN / ED COURSE  patient sees a cardiologist at [facility] has no known history of heart attacks although does have congestive heart failure. Patient has had this kind of pain before.both of his troponins are negative EKG really looks unchanged and improved I will let him go to follow-up with his cardiologist in the next couple days or to follow-up with [facility] medical group here.patient reports he had walked to the refrigerator was standing and drinking cold water when the chest pain started. He has not had pain before with exertion or since then.      ____________________________________________   FINAL CLINICAL IMPRESSION(S) / ED DIAGNOSES  Final diagnoses:  Nonspecific chest pain     ED Discharge Orders    None       Note:  This document was prepared using Dragon voice recognition software and may include unintentional dictation errors.    Arnaldo Natal, MD 05/20/17 1030    Arnaldo Natal, MD 05/20/17 [phone]

## 2017-05-20 NOTE — Discharge Instructions (Signed)
I am not sure what caused your chest pain at this point. The tests we've done here today are negative. Please either call your cardiologist and arrange follow-up in the next couple days or you can see Dr. Kirke CorinArida, the cardiologist on call here today. I have included his name and phone number history the discharge instructions. He should be able to see you tomorrow if you give the office call in the morning and tell them you were in the ER yesterday with chest pain. Please return if chest pain returns again.

## 2017-05-20 NOTE — ED Triage Notes (Signed)
Pt comes into the ED via EMS from home with c/o having chest pain that started acuitly about 1hr PTA, states EMS gave 324mg  ASA and pt pain went from 6-7 to 0 on arrival.

## 2017-05-21 ENCOUNTER — Telehealth: Payer: Self-pay

## 2017-05-21 NOTE — Telephone Encounter (Signed)
Lmov for patient They were seen in ED on 05/20/17  Will need to try again at a later time

## 2017-05-22 NOTE — Telephone Encounter (Signed)
[facility]  Will not need an appointment with us  Nothing else needed

## 2018-05-15 ENCOUNTER — Encounter: Payer: Self-pay | Admitting: Emergency Medicine

## 2018-05-15 ENCOUNTER — Emergency Department: Payer: Medicare HMO

## 2018-05-15 ENCOUNTER — Other Ambulatory Visit: Payer: Self-pay

## 2018-05-15 ENCOUNTER — Emergency Department
Admission: EM | Admit: 2018-05-15 | Discharge: 2018-05-15 | Disposition: A | Discharge status: Home / Self Care | Payer: Medicare HMO | Attending: Emergency Medicine | Admitting: Emergency Medicine

## 2018-05-15 DIAGNOSIS — I5022 Chronic systolic (congestive) heart failure: Secondary | ICD-10-CM | POA: Insufficient documentation

## 2018-05-15 DIAGNOSIS — F17210 Nicotine dependence, cigarettes, uncomplicated: Secondary | ICD-10-CM | POA: Diagnosis not present

## 2018-05-15 DIAGNOSIS — M546 Pain in thoracic spine: Secondary | ICD-10-CM | POA: Insufficient documentation

## 2018-05-15 DIAGNOSIS — S161XXA Strain of muscle, fascia and tendon at neck level, initial encounter: Secondary | ICD-10-CM | POA: Diagnosis not present

## 2018-05-15 DIAGNOSIS — Z7982 Long term (current) use of aspirin: Secondary | ICD-10-CM | POA: Diagnosis not present

## 2018-05-15 DIAGNOSIS — Y999 Unspecified external cause status: Secondary | ICD-10-CM | POA: Insufficient documentation

## 2018-05-15 DIAGNOSIS — M25511 Pain in right shoulder: Secondary | ICD-10-CM | POA: Diagnosis not present

## 2018-05-15 DIAGNOSIS — Y939 Activity, unspecified: Secondary | ICD-10-CM | POA: Insufficient documentation

## 2018-05-15 DIAGNOSIS — Z79899 Other long term (current) drug therapy: Secondary | ICD-10-CM | POA: Insufficient documentation

## 2018-05-15 DIAGNOSIS — R0789 Other chest pain: Secondary | ICD-10-CM

## 2018-05-15 DIAGNOSIS — E785 Hyperlipidemia, unspecified: Secondary | ICD-10-CM | POA: Insufficient documentation

## 2018-05-15 DIAGNOSIS — S199XXA Unspecified injury of neck, initial encounter: Secondary | ICD-10-CM | POA: Diagnosis present

## 2018-05-15 DIAGNOSIS — S0990XA Unspecified injury of head, initial encounter: Secondary | ICD-10-CM | POA: Diagnosis not present

## 2018-05-15 DIAGNOSIS — Y929 Unspecified place or not applicable: Secondary | ICD-10-CM | POA: Insufficient documentation

## 2018-05-15 DIAGNOSIS — I110 Hypertensive heart disease with heart failure: Secondary | ICD-10-CM | POA: Insufficient documentation

## 2018-05-15 MED ORDER — TRAMADOL HCL 50 MG PO TABS: 50.0000 mg | ORAL_TABLET | Freq: Four times a day (QID) | ORAL | 0 refills | Status: DC | PRN

## 2018-05-15 MED ORDER — CYCLOBENZAPRINE HCL 5 MG PO TABS: 5.0000 mg | ORAL_TABLET | Freq: Three times a day (TID) | ORAL | 0 refills | Status: DC | PRN

## 2018-05-15 MED ORDER — OXYCODONE HCL 5 MG PO TABS
5.0000 mg | ORAL_TABLET | Freq: Once | ORAL | Status: AC
  Administered 2018-05-15: 5 mg via ORAL
  Filled 2018-05-15: qty 1

## 2018-05-15 MED ORDER — ACETAMINOPHEN 500 MG PO TABS
1000.0000 mg | ORAL_TABLET | Freq: Once | ORAL | Status: AC
  Administered 2018-05-15: 1000 mg via ORAL
  Filled 2018-05-15: qty 2
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
## 2018-05-15 NOTE — ED Notes (Signed)
Pt has called ride for departure. Pt in NAD
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
## 2018-05-15 NOTE — ED Notes (Signed)
Patient transported to CT 

## 2018-05-15 NOTE — ED Notes (Signed)
Dr. Don PerkingVeronese US pt's abdomen. FAST is negative
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
## 2018-06-05 ENCOUNTER — Other Ambulatory Visit: Payer: Self-pay

## 2018-06-05 ENCOUNTER — Encounter: Payer: Self-pay | Admitting: Intensive Care

## 2018-06-05 ENCOUNTER — Emergency Department
Admission: EM | Admit: 2018-06-05 | Discharge: 2018-06-05 | Disposition: A | Discharge status: Home / Self Care | Payer: Medicare HMO | Attending: Emergency Medicine | Admitting: Emergency Medicine

## 2018-06-05 DIAGNOSIS — I5022 Chronic systolic (congestive) heart failure: Secondary | ICD-10-CM | POA: Insufficient documentation

## 2018-06-05 DIAGNOSIS — Z7982 Long term (current) use of aspirin: Secondary | ICD-10-CM | POA: Insufficient documentation

## 2018-06-05 DIAGNOSIS — I110 Hypertensive heart disease with heart failure: Secondary | ICD-10-CM | POA: Insufficient documentation

## 2018-06-05 DIAGNOSIS — E876 Hypokalemia: Secondary | ICD-10-CM

## 2018-06-05 DIAGNOSIS — R197 Diarrhea, unspecified: Secondary | ICD-10-CM

## 2018-06-05 DIAGNOSIS — Z79899 Other long term (current) drug therapy: Secondary | ICD-10-CM | POA: Diagnosis not present

## 2018-06-05 DIAGNOSIS — F17210 Nicotine dependence, cigarettes, uncomplicated: Secondary | ICD-10-CM | POA: Insufficient documentation

## 2018-06-05 DIAGNOSIS — R1030 Lower abdominal pain, unspecified: Secondary | ICD-10-CM | POA: Diagnosis present

## 2018-06-05 LAB — URINALYSIS, COMPLETE (UACMP) WITH MICROSCOPIC
Bacteria, UA: NONE SEEN
Bilirubin Urine: NEGATIVE
Glucose, UA: NEGATIVE mg/dL
Ketones, ur: NEGATIVE mg/dL
Leukocytes, UA: NEGATIVE
Nitrite: NEGATIVE
Protein, ur: 30 mg/dL — AB
Specific Gravity, Urine: 1.028 (ref 1.005–1.030)
pH: 6 (ref 5.0–8.0)

## 2018-06-05 LAB — CBC
HCT: 38.9 % — ABNORMAL LOW (ref 39.0–52.0)
Hemoglobin: 12.3 g/dL — ABNORMAL LOW (ref 13.0–17.0)
MCH: 24.6 pg — ABNORMAL LOW (ref 26.0–34.0)
MCHC: 31.6 g/dL (ref 30.0–36.0)
MCV: 78 fL — ABNORMAL LOW (ref 80.0–100.0)
Platelets: 227 10*3/uL (ref 150–400)
RBC: 4.99 MIL/uL (ref 4.22–5.81)
RDW: 13.6 % (ref 11.5–15.5)
WBC: 6.8 10*3/uL (ref 4.0–10.5)
nRBC: 0 % (ref 0.0–0.2)

## 2018-06-05 LAB — COMPREHENSIVE METABOLIC PANEL
ALK PHOS: 62 U/L (ref 38–126)
ALT: 22 U/L (ref 0–44)
AST: 33 U/L (ref 15–41)
Albumin: 4 g/dL (ref 3.5–5.0)
Anion gap: 9 (ref 5–15)
BUN: 13 mg/dL (ref 8–23)
CALCIUM: 10 mg/dL (ref 8.9–10.3)
CO2: 27 mmol/L (ref 22–32)
Chloride: 102 mmol/L (ref 98–111)
Creatinine, Ser: 1.14 mg/dL (ref 0.61–1.24)
GFR calc Af Amer: >60 mL/min (ref >60)
GFR calc non Af Amer: >60 mL/min (ref >60)
Glucose, Bld: 114 mg/dL — ABNORMAL HIGH (ref 70–99)
Potassium: 2.7 mmol/L — CL (ref 3.5–5.1)
Sodium: 138 mmol/L (ref 135–145)
TOTAL PROTEIN: 7.8 g/dL (ref 6.5–8.1)
Total Bilirubin: 1 mg/dL (ref 0.3–1.2)

## 2018-06-05 LAB — LIPASE, BLOOD: Lipase: 40 U/L (ref 11–51)

## 2018-06-05 MED ORDER — POTASSIUM CHLORIDE ER 10 MEQ PO TBCR: 20.0000 meq | EXTENDED_RELEASE_TABLET | Freq: Every day | ORAL | 0 refills | Status: DC

## 2018-06-05 MED ORDER — LOPERAMIDE HCL 2 MG PO TABS: 2.0000 mg | ORAL_TABLET | Freq: Four times a day (QID) | ORAL | 0 refills | Status: DC | PRN

## 2018-06-05 MED ORDER — POTASSIUM CHLORIDE CRYS ER 20 MEQ PO TBCR
40.0000 meq | EXTENDED_RELEASE_TABLET | Freq: Once | ORAL | Status: AC
  Administered 2018-06-05: 40 meq via ORAL
  Filled 2018-06-05: qty 2

## 2018-06-05 NOTE — Progress Notes (Signed)
   06/05/18 1500  Clinical Encounter Type  Visited With Patient  Visit Type Initial  Stress Factors  Patient Stress Factors Lack of knowledge    Patient frustrated with wait in ED for discharge. He notes that he has missed an appointment elsewhere. Chaplain followed up with nurse to regarding status and next steps regarding discharge. Nurse now at the bedside.

## 2018-06-05 NOTE — ED Provider Notes (Signed)
[facility] Emergency Department Provider Note  ____________________________________________   First MD Initiated Contact with Patient 06/05/18 1400     (approximate)  I have reviewed the triage vital signs and the nursing notes.   HISTORY  Chief Complaint Abdominal Pain   HPI Jesse FavaGarland Bibby Sr. is a 68 y.o. male with a history of angina and CHF was presented emergency department today with lower abdominal cramping over the past 2 days.  He says that he has having approximately 2 episodes of loose stool per day.  Denies any blood in the stool.  Is not reporting any nausea or vomiting.  Denies any recent antibiotics or overnight stays in the hospital.  Patient says that he has intermittent cramping to the lower abdomen but is pain-free at this time.  Does not report any radiation to the back.  Denies any burning on urination or frequency.  Patient states that he is looking for a medication recommendation for relief of his diarrhea and is also concerned about his potassium being low.   Past Medical History:  Diagnosis Date  . Anginal pain (HCC)   . Cardiomegaly 12/05/2011   /H&P  . CHF (congestive heart failure) (HCC)    Dr. Devonne DoughtyKirkwood Adams, [facility]  . Hypertension     Patient Active Problem List   Diagnosis Date Noted  . Chest pain, unspecified 12/05/2011  . Nonspecific abnormal electrocardiogram (ECG) (EKG) 12/05/2011  . Essential hypertension, benign 12/05/2011  . Chronic systolic heart failure (HCC) 12/05/2011    Past Surgical History:  Procedure Laterality Date  . NO PAST SURGERIES      Prior to Admission medications   Medication Sig Start Date End Date Taking? Authorizing Provider  aspirin EC 81 MG tablet Take 81 mg by mouth daily.    [provider]  carvedilol (COREG) 12.5 MG tablet Take 12.5 mg by mouth 2 (two) times daily with a meal.    [provider]  cyclobenzaprine (FLEXERIL) 5 MG tablet Take 1  tablet (5 mg total) by mouth 3 (three) times daily as needed for muscle spasms. 05/15/18   Nita SickleVeronese, Athalia, MD  doxycycline (VIBRA-TABS) 100 MG tablet Take 1 tablet by mouth 2 (two) times daily. 05/14/17   [provider]  eplerenone (INSPRA) 50 MG tablet Take 1 tablet by mouth daily. 02/21/17   [provider]  furosemide (LASIX) 20 MG tablet Take 20 mg by mouth daily.     [provider]  loperamide (IMODIUM A-D) 2 MG tablet Take 1 tablet (2 mg total) by mouth 4 (four) times daily as needed for diarrhea or loose stools. 06/05/18   Leeanne Butters, Myra Rudeavid Matthew, MD  potassium chloride (K-DUR) 10 MEQ tablet Take 2 tablets (20 mEq total) by mouth daily. 06/05/18   Myrna BlazerSchaevitz, Tariq Pernell Matthew, MD  pravastatin (PRAVACHOL) 20 MG tablet Take 20 mg by mouth every evening.    [provider]  sertraline (ZOLOFT) 25 MG tablet Take 25 mg by mouth daily.    [provider]  spironolactone (ALDACTONE) 25 MG tablet Take 50 mg by mouth daily.     [provider]  traMADol (ULTRAM) 50 MG tablet Take 1 tablet (50 mg total) by mouth every 6 (six) hours as needed. 05/15/18 05/15/19  Nita SickleVeronese, Loxley, MD    Allergies Lisinopril  History reviewed. No pertinent family history.  Social History Social History   Tobacco Use  . Smoking status: Current Some Day Smoker    Packs/day: 0.10  Types: Cigarettes  . Smokeless tobacco: Never Used  . Tobacco comment: 12/05/2011 States that he smokes 5 cigarettes a week  Substance Use Topics  . Alcohol use: Yes    Alcohol/week: 5.0 standard drinks    Types: 5 Cans of beer per week  . Drug use: No    Review of Systems  Constitutional: No fever/chills Eyes: No visual changes. ENT: No sore throat. Cardiovascular: Denies chest pain. Respiratory: Denies shortness of breath. Gastrointestinal:  No constipation. Genitourinary: Negative for dysuria. Musculoskeletal: Negative for back pain. Skin: Negative for  rash. Neurological: Negative for headaches, focal weakness or numbness.   ____________________________________________   PHYSICAL EXAM:  VITAL SIGNS: ED Triage Vitals  Enc Vitals Group     BP 06/05/18 1057 (!) 147/86     Pulse Rate 06/05/18 1057 71     Resp 06/05/18 1057 15     Temp 06/05/18 1057 98 F (36.7 C)     Temp Source 06/05/18 1057 Oral     SpO2 06/05/18 1057 98 %     Weight 06/05/18 1058 180 lb (81.6 kg)     Height 06/05/18 1058 5\' 11"  (1.803 m)     Head Circumference --      Peak Flow --      Pain Score 06/05/18 1102 0     Pain Loc --      Pain Edu? --      Excl. in GC? --     Constitutional: Alert and oriented. Well appearing and in no acute distress. Eyes: Conjunctivae are normal.  Head: Atraumatic. Nose: No congestion/rhinnorhea. Mouth/Throat: Mucous membranes are moist.  Neck: No stridor.   Cardiovascular: Normal rate, regular rhythm. Grossly normal heart sounds. Respiratory: Normal respiratory effort.  No retractions. Lungs CTAB. Gastrointestinal: Soft and nontender. No distention. Musculoskeletal: No lower extremity tenderness nor edema.  No joint effusions. Neurologic:  Normal speech and language. No gross focal neurologic deficits are appreciated. Skin:  Skin is warm, dry and intact. No rash noted. Psychiatric: Mood and affect are normal. Speech and behavior are normal.  ____________________________________________   LABS (all labs ordered are listed, but only abnormal results are displayed)  Labs Reviewed  COMPREHENSIVE METABOLIC PANEL - Abnormal; Notable for the following components:      Result Value   Potassium 2.7 (*)    Glucose, Bld 114 (*)    All other components within normal limits  CBC - Abnormal; Notable for the following components:   Hemoglobin 12.3 (*)    HCT 38.9 (*)    MCV 78.0 (*)    MCH 24.6 (*)    All other components within normal limits  URINALYSIS, COMPLETE (UACMP) WITH MICROSCOPIC - Abnormal; Notable for the  following components:   Color, Urine AMBER (*)    APPearance CLEAR (*)    Hgb urine dipstick SMALL (*)    Protein, ur 30 (*)    All other components within normal limits  LIPASE, BLOOD   ____________________________________________  EKG   ____________________________________________  RADIOLOGY   ____________________________________________   PROCEDURES  Procedure(s) performed:   Procedures  Critical Care performed:   ____________________________________________   INITIAL IMPRESSION / ASSESSMENT AND PLAN / ED COURSE  Pertinent labs & imaging results that were available during my care of the patient were reviewed by me and considered in my medical decision making (see chart for details).  Differential diagnosis includes, but is not limited to, acute appendicitis, renal colic, testicular torsion, urinary tract infection/pyelonephritis, prostatitis,  epididymitis, diverticulitis, small bowel  obstruction or ileus, colitis, abdominal aortic aneurysm, gastroenteritis, hernia, etc. As part of my medical decision making, I reviewed the following data within the electronic MEDICAL RECORD NUMBER Notes from prior ED visits  ----------------------------------------- 3:31 PM on 06/05/2018 -----------------------------------------  Patient says that he needs to be discharged because he has an appointment at 3:30 PM.  He was ordered 40 mEq of potassium here in the emergency department and will be be discharged home with 2 days of potassium supplementation.  Because he has listed eplerenone as well as spironolactone I will give him a lower dose of potassium.  Also will give Imodium for symptomatic relief.  Patient understanding of the diagnosis well treatment and willing to comply.  I do not believe the patient needs a CT scan at this time.  He is nontender.  He has normal white blood cell count.  Reassuring vital signs and has a benign appearance.  Likely viral cause of his  diarrhea. ____________________________________________   FINAL CLINICAL IMPRESSION(S) / ED DIAGNOSES  Final diagnoses:  Lower abdominal pain  Diarrhea, unspecified type  Hypokalemia      NEW MEDICATIONS STARTED DURING THIS VISIT:  Current Discharge Medication List    START taking these medications   Details  loperamide (IMODIUM A-D) 2 MG tablet Take 1 tablet (2 mg total) by mouth 4 (four) times daily as needed for diarrhea or loose stools. Qty: 4 tablet, Refills: 0    potassium chloride (K-DUR) 10 MEQ tablet Take 2 tablets (20 mEq total) by mouth daily. Qty: 4 tablet, Refills: 0         Note:  This document was prepared using Dragon voice recognition software and may include unintentional dictation errors.     Myrna Blazer, MD 06/05/18 [phone]

## 2018-06-05 NOTE — ED Triage Notes (Signed)
Patient c/o lower abdominal pain since yesterday that hits him about every 30 minutes. Reports diarrhea every couple of hours. Ambulatory with no problems

## 2018-06-05 NOTE — ED Notes (Signed)
Pt states has a tendency for low potassium and last took potassium yesterday. No diarrhea since arrival to ed.

## 2019-01-08 ENCOUNTER — Other Ambulatory Visit: Payer: Self-pay

## 2019-01-08 ENCOUNTER — Emergency Department
Admission: EM | Admit: 2019-01-08 | Discharge: 2019-01-08 | Disposition: A | Discharge status: Home / Self Care | Payer: Medicare HMO | Attending: Emergency Medicine | Admitting: Emergency Medicine

## 2019-01-08 DIAGNOSIS — Z79899 Other long term (current) drug therapy: Secondary | ICD-10-CM | POA: Insufficient documentation

## 2019-01-08 DIAGNOSIS — M546 Pain in thoracic spine: Secondary | ICD-10-CM | POA: Diagnosis present

## 2019-01-08 DIAGNOSIS — L089 Local infection of the skin and subcutaneous tissue, unspecified: Secondary | ICD-10-CM | POA: Diagnosis not present

## 2019-01-08 DIAGNOSIS — Z7982 Long term (current) use of aspirin: Secondary | ICD-10-CM | POA: Diagnosis not present

## 2019-01-08 DIAGNOSIS — F17210 Nicotine dependence, cigarettes, uncomplicated: Secondary | ICD-10-CM | POA: Insufficient documentation

## 2019-01-08 DIAGNOSIS — L723 Sebaceous cyst: Secondary | ICD-10-CM | POA: Diagnosis not present

## 2019-01-08 DIAGNOSIS — I110 Hypertensive heart disease with heart failure: Secondary | ICD-10-CM | POA: Diagnosis not present

## 2019-01-08 DIAGNOSIS — I5022 Chronic systolic (congestive) heart failure: Secondary | ICD-10-CM | POA: Insufficient documentation

## 2019-01-08 MED ORDER — TRAMADOL HCL 50 MG PO TABS: 50.0000 mg | ORAL_TABLET | Freq: Four times a day (QID) | ORAL | 0 refills | Status: DC | PRN

## 2019-01-08 MED ORDER — SULFAMETHOXAZOLE-TRIMETHOPRIM 800-160 MG PO TABS: 1.0000 | ORAL_TABLET | Freq: Two times a day (BID) | ORAL | 0 refills | Status: DC

## 2019-01-08 NOTE — ED Triage Notes (Signed)
Pt c/o red raised area on his right upper back for the past week. No drainage noted at this time.

## 2019-01-08 NOTE — ED Notes (Signed)
See triage note  Presents with abscess to upper back  States he has had same area lanced several times

## 2019-01-08 NOTE — ED Provider Notes (Signed)
[facility] Emergency Department Provider Note   ____________________________________________   First MD Initiated Contact with Patient 01/08/19 1210     (approximate)  I have reviewed the triage vital signs and the nursing notes.   HISTORY  Chief Complaint Abscess    HPI Sheran FavaGarland Papesh Sr. is a 68 y.o. male patient presents for recurrent lesion to the upper back.  Patient state he had 4 previous incision and drainage to this area.  Patient was advised that he would need to see a surgeon for definitive excision.  Patient is asking for consult to the surgical clinic.  Patient states lesion is currently painful without drainage.         Past Medical History:  Diagnosis Date  . Anginal pain (HCC)   . Cardiomegaly 12/05/2011   /H&P  . CHF (congestive heart failure) (HCC)    Dr. Devonne DoughtyKirkwood Adams, [facility]  . Hypertension     Patient Active Problem List   Diagnosis Date Noted  . Chest pain, unspecified 12/05/2011  . Nonspecific abnormal electrocardiogram (ECG) (EKG) 12/05/2011  . Essential hypertension, benign 12/05/2011  . Chronic systolic heart failure (HCC) 12/05/2011    Past Surgical History:  Procedure Laterality Date  . NO PAST SURGERIES      Prior to Admission medications   Medication Sig Start Date End Date Taking? Authorizing Provider  aspirin EC 81 MG tablet Take 81 mg by mouth daily.    [provider]  carvedilol (COREG) 12.5 MG tablet Take 12.5 mg by mouth 2 (two) times daily with a meal.    [provider]  eplerenone (INSPRA) 50 MG tablet Take 1 tablet by mouth daily. 02/21/17   [provider]  furosemide (LASIX) 20 MG tablet Take 20 mg by mouth daily.     [provider]  loperamide (IMODIUM A-D) 2 MG tablet Take 1 tablet (2 mg total) by mouth 4 (four) times daily as needed for diarrhea or loose stools. 06/05/18   Schaevitz, Myra Rudeavid Matthew, MD  potassium chloride (K-DUR) 10  MEQ tablet Take 2 tablets (20 mEq total) by mouth daily. 06/05/18   Myrna BlazerSchaevitz, David Matthew, MD  pravastatin (PRAVACHOL) 20 MG tablet Take 20 mg by mouth every evening.    [provider]  sertraline (ZOLOFT) 25 MG tablet Take 25 mg by mouth daily.    [provider]  spironolactone (ALDACTONE) 25 MG tablet Take 50 mg by mouth daily.     [provider]  sulfamethoxazole-trimethoprim (BACTRIM DS) 800-160 MG tablet Take 1 tablet by mouth 2 (two) times daily. 01/08/19   Joni ReiningSmith, Yukiko Minnich K, PA-C  traMADol (ULTRAM) 50 MG tablet Take 1 tablet (50 mg total) by mouth every 6 (six) hours as needed. 01/08/19 01/08/20  Joni ReiningSmith, Yicel Shannon K, PA-C    Allergies Lisinopril  No family history on file.  Social History Social History   Tobacco Use  . Smoking status: Current Some Day Smoker    Packs/day: 0.10    Types: Cigarettes  . Smokeless tobacco: Never Used  . Tobacco comment: 12/05/2011 States that he smokes 5 cigarettes a week  Substance Use Topics  . Alcohol use: Yes    Alcohol/week: 5.0 standard drinks    Types: 5 Cans of beer per week  . Drug use: No    Review of Systems  Constitutional: No fever/chills Eyes: No visual changes. ENT: No sore throat. Cardiovascular: Denies chest pain. Respiratory: Denies shortness of breath. Gastrointestinal: No abdominal pain.  No  nausea, no vomiting.  No diarrhea.  No constipation. Genitourinary: Negative for dysuria. Musculoskeletal: Negative for back pain. Skin: Nodule lesion upper back.   Neurological: Negative for headaches, focal weakness or numbness. Endocrine:  Hypertension Hematological/Lymphatic:  Allergic/Immunilogical: Lisinopril ____________________________________________   PHYSICAL EXAM:  VITAL SIGNS: ED Triage Vitals  Enc Vitals Group     BP 01/08/19 1023 136/81     Pulse Rate 01/08/19 1023 70     Resp --      Temp 01/08/19 1023 98.3 F (36.8 C)     Temp Source 01/08/19 1023 Oral     SpO2 01/08/19 1023 96  %     Weight 01/08/19 1024 179 lb (81.2 kg)     Height 01/08/19 1024 6' (1.829 m)     Head Circumference --      Peak Flow --      Pain Score 01/08/19 1030 9     Pain Loc --      Pain Edu? --      Excl. in GC? --     Constitutional: Alert and oriented. Well appearing and in no acute distress. Hematological/Lymphatic/Immunilogical: No cervical lymphadenopathy. Cardiovascular: Normal rate, regular rhythm. Grossly normal heart sounds.  Good peripheral circulation. Respiratory: Normal respiratory effort.  No retractions. Lungs CTAB. Skin:  Skin is warm, dry and intact.  Edematous erythematous nodule lesion upper back. Psychiatric: Mood and affect are normal. Speech and behavior are normal.  ____________________________________________   LABS (all labs ordered are listed, but only abnormal results are displayed)  Labs Reviewed - No data to display ____________________________________________  EKG   ____________________________________________  RADIOLOGY  ED MD interpretation:    Official radiology report(s): No results found.  ____________________________________________   PROCEDURES  Procedure(s) performed (including Critical Care):  Procedures   ____________________________________________   INITIAL IMPRESSION / ASSESSMENT AND PLAN / ED COURSE  As part of my medical decision making, I reviewed the following data within the electronic MEDICAL RECORD NUMBER         Christerfer Ramaley Sr. was evaluated in Emergency Department on 01/08/2019 for the symptoms described in the history of present illness. He was evaluated in the context of the global COVID-19 pandemic, which necessitated consideration that the patient might be at risk for infection with the SARS-CoV-2 virus that causes COVID-19. Institutional protocols and algorithms that pertain to the evaluation of patients at risk for COVID-19 are in a state of rapid change based on information released by regulatory bodies  including the CDC and federal and state organizations. These policies and algorithms were followed during the patient's care in the ED.   Patient presents with infected sebaceous cyst upper back.  Patient requests definitive evaluation treatment by surgical clinic.  Patient started on antibiotics and pain medication.  A bulky dressing placed over lesion.  Patient will contact surgical clinic in the morning to schedule appointment.      ____________________________________________   FINAL CLINICAL IMPRESSION(S) / ED DIAGNOSES  Final diagnoses:  Infected sebaceous cyst     ED Discharge Orders         Ordered    sulfamethoxazole-trimethoprim (BACTRIM DS) 800-160 MG tablet  2 times daily     01/08/19 1217    traMADol (ULTRAM) 50 MG tablet  Every 6 hours PRN     01/08/19 1217           Note:  This document was prepared using Dragon voice recognition software and may include unintentional dictation errors.    Joni Reining,  PA-C 01/08/19 1225    Earleen Newport, MD 01/08/19 1327

## 2019-01-08 NOTE — Discharge Instructions (Addendum)
Follow discharge care instruction take medication as directed.  Call surgical clinic tomorrow after 8:30 AM tomorrow  to schedule appointment.  Advised them you are a follow-up from emergency department.

## 2019-01-09 ENCOUNTER — Encounter: Payer: Self-pay | Admitting: Physician Assistant

## 2019-01-09 ENCOUNTER — Ambulatory Visit (INDEPENDENT_AMBULATORY_CARE_PROVIDER_SITE_OTHER): Payer: Medicare HMO | Admitting: Physician Assistant

## 2019-01-09 VITALS — BP 151/103 | HR 100 | Temp 97.8°F | Ht 72.0 in | Wt 181.0 lb

## 2019-01-09 DIAGNOSIS — L02212 Cutaneous abscess of back [any part, except buttock]: Secondary | ICD-10-CM | POA: Diagnosis not present

## 2019-01-09 NOTE — Progress Notes (Signed)
[facility] NEW PATIENT  Referring provider: Nona Dell, PA-C  HISTORY OF PRESENT ILLNESS (HPI):  68 y.o. male presents for evaluation of right upper back cyst. Patient reports a history of similar cysts over the course of the last ~2 years. This has recurred multiple times in the past and has required 4 previous I&Ds. This most recent episode started about 3 weeks ago and has progressively worsened. No associated fevers or chills. He went to the ED for this yesterday (09/10) and was started on Bactrim x10 days and referred here for evaluation of surgical removal. This morning, the cyst spontaneously opened and began to drain purulent material. He has been evaluated by plastic surgery at [facility] in 2018 for removal but was lost to follow up. No other acute issues or concerns.      PAST MEDICAL HISTORY (PMH):  Past Medical History:  Diagnosis Date  . Anginal pain (HCC)   . Cardiomegaly 12/05/2011   /H&P  . CHF (congestive heart failure) (HCC)    Dr. Devonne Doughty, [employer]  . Hypertension      PAST SURGICAL HISTORY (PSH):  Past Surgical History:  Procedure Laterality Date  . NO PAST SURGERIES       MEDICATIONS:  Prior to Admission medications   Medication Sig Start Date End Date Taking? Authorizing Provider  aspirin EC 81 MG tablet Take 81 mg by mouth daily.   Yes [provider]  carvedilol (COREG) 12.5 MG tablet Take 12.5 mg by mouth 2 (two) times daily with a meal.   Yes [provider]  eplerenone (INSPRA) 50 MG tablet Take 1 tablet by mouth daily. 02/21/17  Yes [provider]  furosemide (LASIX) 20 MG tablet Take 20 mg by mouth daily.    Yes [provider]  loperamide (IMODIUM A-D) 2 MG tablet Take 1 tablet (2 mg total) by mouth 4 (four) times daily as needed for diarrhea or loose stools. 06/05/18  Yes Schaevitz, Myra Rude, MD  potassium chloride (K-DUR) 10 MEQ tablet Take 2 tablets (20 mEq  total) by mouth daily. 06/05/18  Yes Myrna Blazer, MD  pravastatin (PRAVACHOL) 20 MG tablet Take 20 mg by mouth every evening.   Yes [provider]  sertraline (ZOLOFT) 25 MG tablet Take 25 mg by mouth daily.   Yes [provider]  spironolactone (ALDACTONE) 25 MG tablet Take 50 mg by mouth daily.    Yes [provider]  sulfamethoxazole-trimethoprim (BACTRIM DS) 800-160 MG tablet Take 1 tablet by mouth 2 (two) times daily. 01/08/19  Yes Joni Reining, PA-C  traMADol (ULTRAM) 50 MG tablet Take 1 tablet (50 mg total) by mouth every 6 (six) hours as needed. 01/08/19 01/08/20 Yes Joni Reining, PA-C     ALLERGIES:  Allergies  Allergen Reactions  . Lisinopril Swelling     SOCIAL HISTORY:  Social History   Socioeconomic History  . Marital status: Legally Separated    Spouse name: Not on file  . Number of children: Not on file  . Years of education: Not on file  . Highest education level: Not on file  Occupational History  . Not on file  Social Needs  . Financial resource strain: Not on file  . Food insecurity    Worry: Not on file    Inability: Not on file  . Transportation needs    Medical: Not on file    Non-medical: Not on file  Tobacco Use  .  Smoking status: Current Some Day Smoker    Packs/day: 0.10    Types: Cigarettes  . Smokeless tobacco: Never Used  . Tobacco comment: 12/05/2011 States that he smokes 5 cigarettes a week  Substance and Sexual Activity  . Alcohol use: Yes    Alcohol/week: 5.0 standard drinks    Types: 5 Cans of beer per week  . Drug use: No  . Sexual activity: Yes  Lifestyle  . Physical activity    Days per week: Not on file    Minutes per session: Not on file  . Stress: Not on file  Relationships  . Social Herbalist on phone: Not on file    Gets together: Not on file    Attends religious service: Not on file    Active member of club or organization: Not on file    Attends meetings of clubs or  organizations: Not on file    Relationship status: Not on file  . Intimate partner violence    Fear of current or ex partner: Not on file    Emotionally abused: Not on file    Physically abused: Not on file    Forced sexual activity: Not on file  Other Topics Concern  . Not on file  Social History Narrative  . Not on file    The patient currently resides (home / rehab facility / nursing home): Home The patient normally is (ambulatory / bedbound): Ambulatory  FAMILY HISTORY:  History reviewed. No pertinent family history.  Otherwise negative/non-contributory.  REVIEW OF SYSTEMS:  Constitutional: denies any other weight loss, fever, chills, or sweats  Eyes: denies any other vision changes, history of eye injury  ENT: denies sore throat, hearing problems  Respiratory: denies shortness of breath, wheezing  Cardiovascular: denies chest pain, palpitations  Gastrointestinal: denies abdominal pain, N/V, or diarrhea/and bowel function as per HPI Musculoskeletal: denies any other joint pains or cramps  Skin: + Right Upper Back Abscess/Cyst Neurological: denies any other headache, dizziness, weakness  Psychiatric: Denies any other depression, anxiety   All other review of systems were otherwise negative   VITAL SIGNS:  @VSRANGES @     Height: 6' (182.9 cm) Weight: 181 lb (82.1 kg) BMI (Calculated): 24.54   PHYSICAL EXAM:  Constitutional:  -- Normal body habitus  -- Awake, alert, and oriented x3  Eyes:  -- Pupils equally round and reactive to light  -- No scleral icterus  Ear, nose, throat:  -- No nasal drainage, bleeding Pulmonary:  -- Equal breath sounds bilaterally -- Breathing non-labored at rest Cardiovascular:  -- S1, S2 present  -- No pericardial rubs  Musculoskeletal and Integumentary:  -- There is a approximately 4x4 cm raised, erythematous, and tender area to the right upper back, there is a central opening which is draining purulent material. There is palpable  fluctuance centrally and surrounding induration.    Labs: No pertinent labs to this visit.   Imaging studies:  No pertinent imaging studies to this visit    Assessment/Plan:  68 y.o. male with what appears to be a recurrent infected, open, and draining right upper back cyst.   - Pain control PRN  - Complete course of Abx as prescribed by ED (Bactrim BID x10 days)   - Cultures obtained in office; will follow    - Offered more definitive I&D in office today but patient declined. I was able to express most of the purulence from this cyst.   - Return precautions given  - return  to clinic in 2 weeks with Dr Aleen CampiPiscoya for in-office cyst removal.    [name] spent with the patient and care providers was 30 minutes, with more than 50% of the time spent counseling, educating, and coordinating care of the patient.     Thank you for the opportunity to participate in this patient's care.  Lynden OxfordZachary Faiga Stones, PA-C [facility] 01/09/2019, 11:39 AM [phone] M-F: 7am - 4pm

## 2019-01-09 NOTE — Patient Instructions (Addendum)
Return in three  weeks. Back excision

## 2019-01-17 LAB — ANAEROBIC AND AEROBIC CULTURE: Result 1: NEGATIVE — AB

## 2019-01-30 ENCOUNTER — Ambulatory Visit (INDEPENDENT_AMBULATORY_CARE_PROVIDER_SITE_OTHER): Payer: Medicare HMO | Admitting: Surgery

## 2019-01-30 ENCOUNTER — Other Ambulatory Visit: Payer: Self-pay

## 2019-01-30 ENCOUNTER — Encounter: Payer: Self-pay | Admitting: Surgery

## 2019-01-30 VITALS — BP 152/96 | HR 69 | Temp 97.2°F | Ht 72.0 in | Wt 183.0 lb

## 2019-01-30 DIAGNOSIS — L02212 Cutaneous abscess of back [any part, except buttock]: Secondary | ICD-10-CM

## 2019-01-30 NOTE — Progress Notes (Signed)
01/30/2019  History of Present Illness: Jesse Belmontes Sr. is a 68 y.o. male who presented to our office on 9/11 with an infected right upper back abscess.  He has a history of prior I&Ds of that area.  He underwent I&D in the office and presents today for excision.  However, the patient now reports that he would not like the area excised in the office and would rather have general anesthesia for it.  He also would like to have a referral to [facility] general surgery for it.  Past Medical History: Past Medical History:  Diagnosis Date  . Anginal pain ([place])   . Cardiomegaly 12/05/2011   /H&P  . CHF (congestive heart failure) (HCC)    Dr. Carolynn Serve, [facility]  . Hypertension      Past Surgical History: Past Surgical History:  Procedure Laterality Date  . Multiple I&Ds of back cysts      Home Medications: Prior to Admission medications   Medication Sig Start Date End Date Taking? Authorizing Provider  aspirin EC 81 MG tablet Take 81 mg by mouth daily.   Yes [provider]  carvedilol (COREG) 12.5 MG tablet Take 12.5 mg by mouth 2 (two) times daily with a meal.   Yes [provider]  eplerenone (INSPRA) 50 MG tablet Take 1 tablet by mouth daily. 02/21/17  Yes [provider]  furosemide (LASIX) 20 MG tablet Take 20 mg by mouth daily.    Yes [provider]  loperamide (IMODIUM A-D) 2 MG tablet Take 1 tablet (2 mg total) by mouth 4 (four) times daily as needed for diarrhea or loose stools. 06/05/18  Yes Schaevitz, Randall An, MD  potassium chloride (K-DUR) 10 MEQ tablet Take 2 tablets (20 mEq total) by mouth daily. 06/05/18  Yes Orbie Pyo, MD  pravastatin (PRAVACHOL) 20 MG tablet Take 20 mg by mouth every evening.   Yes [provider]  sertraline (ZOLOFT) 25 MG tablet Take 25 mg by mouth daily.   Yes [provider]  spironolactone (ALDACTONE) 25 MG tablet Take 50 mg by mouth daily.    Yes [provider]  sulfamethoxazole-trimethoprim (BACTRIM DS) 800-160 MG tablet Take 1 tablet by mouth 2 (two) times daily. 01/08/19  Yes Sable Feil, PA-C  traMADol (ULTRAM) 50 MG tablet Take 1 tablet (50 mg total) by mouth every 6 (six) hours as needed. 01/08/19 01/08/20 Yes Sable Feil, PA-C    Allergies: Allergies  Allergen Reactions  . Lisinopril Swelling    Review of Systems: Review of Systems  Constitutional: Negative for chills and fever.  Respiratory: Negative for shortness of breath.   Cardiovascular: Negative for chest pain.  Gastrointestinal: Negative for nausea and vomiting.    Physical Exam BP (!) 152/96   Pulse 69   Temp (!) 97.2 F (36.2 C) (Temporal)   Ht 6' (1.829 m)   Wt 183 lb (83 kg)   SpO2 98%   BMI 24.82 kg/m  CONSTITUTIONAL: No acute distress HEENT:  Normocephalic, atraumatic, extraocular motion intact. RESPIRATORY:  Lungs are clear, and breath sounds are equal bilaterally. Normal respiratory effort without pathologic use of accessory muscles. CARDIOVASCULAR: Heart is regular without murmurs, gallops, or rubs. SKIN:  Patient is s/p I&D of right upper back cyst.  Area is completely healed, without any induration, erythema, or drainage.  Patient has evidence of keloid scar from prior I&D in the same location. NEUROLOGIC:  Motor and sensation is grossly normal.  Cranial nerves  are grossly intact. PSYCH:  Alert and oriented to person, place and time. Affect is normal.  Assessment and Plan: This is a 68 y.o. male s/p I&D of infected right upper back cyst.  Discussed with the patient that we would be happy to schedule the excision with general anesthesia here at [facility], but he prefers to have a referral for [facility].  He would not like [facility].  We'll be happy to send referral to [facility], and discussed with the patient that if there are any issues, he can always call us and be seen here.  Otherwise follow up prn.  [name] spent with the  patient and care providers was 15 minutes, with more than 50% of the time spent counseling, educating, and coordinating care of the patient.     Howie Ill, MD [facility]

## 2019-01-30 NOTE — Patient Instructions (Addendum)
Patient rescheduled to see Dr.Piscoya 02/04/2019.

## 2019-02-04 ENCOUNTER — Ambulatory Visit (INDEPENDENT_AMBULATORY_CARE_PROVIDER_SITE_OTHER): Payer: Medicare HMO | Admitting: Surgery

## 2019-02-04 ENCOUNTER — Other Ambulatory Visit: Payer: Self-pay

## 2019-02-04 ENCOUNTER — Encounter: Payer: Self-pay | Admitting: Surgery

## 2019-02-04 VITALS — BP 168/97 | HR 66 | Temp 97.3°F | Ht 72.0 in | Wt 181.6 lb

## 2019-02-04 DIAGNOSIS — L723 Sebaceous cyst: Secondary | ICD-10-CM

## 2019-02-04 DIAGNOSIS — L089 Local infection of the skin and subcutaneous tissue, unspecified: Secondary | ICD-10-CM | POA: Diagnosis not present

## 2019-02-04 NOTE — Patient Instructions (Addendum)
Stop taking your Aspirin after 02/10/19.  We will send a referral for Cardiac Clearance to your Cardiologist.   We have scheduled you for surgery at [facility] on 02/16/19. You will have a Covid-19 test completed about 3 days prior to this.  Please refer to your [facility] surgery sheet for information. Our surgery scheduler will be calling you to go over information for your surgery.

## 2019-02-04 NOTE — Progress Notes (Signed)
Cardiac Clearance request has been faxed over to Dr Carolynn Serve at [facility]: [phone] Fax: [phone]

## 2019-02-05 ENCOUNTER — Encounter: Payer: Self-pay | Admitting: Surgery

## 2019-02-05 NOTE — H&P (View-Only) (Signed)
02/05/2019  History of Present Illness: Jesse Leider Sr. is a 68 y.o. male presenting for surgery discussion.  He was seen last on 10/2 at which time he wanted to do the cyst excision in the OR under anesthesia, but wanted referral to [facility].  However, now he is ok with doing the surgery at [facility] and presents today to discuss surgery and schedule.  Denies any new issues with the wound.  Past Medical History: Past Medical History:  Diagnosis Date  . Anginal pain ([place])   . Cardiomegaly 12/05/2011   /H&P  . CHF (congestive heart failure) (HCC)    Dr. Carolynn Serve, [facility]  . Hypertension      Past Surgical History: Past Surgical History:  Procedure Laterality Date  . NO PAST SURGERIES      Home Medications: Prior to Admission medications   Medication Sig Start Date End Date Taking? Authorizing Provider  aspirin EC 81 MG tablet Take 81 mg by mouth daily.   Yes [provider]  carvedilol (COREG) 12.5 MG tablet Take 12.5 mg by mouth 2 (two) times daily with a meal.   Yes [provider]  eplerenone (INSPRA) 50 MG tablet Take 1 tablet by mouth daily. 02/21/17  Yes [provider]  furosemide (LASIX) 20 MG tablet Take 20 mg by mouth daily.    Yes [provider]  loperamide (IMODIUM A-D) 2 MG tablet Take 1 tablet (2 mg total) by mouth 4 (four) times daily as needed for diarrhea or loose stools. 06/05/18  Yes Schaevitz, Randall An, MD  potassium chloride (K-DUR) 10 MEQ tablet Take 2 tablets (20 mEq total) by mouth daily. 06/05/18  Yes Orbie Pyo, MD  pravastatin (PRAVACHOL) 20 MG tablet Take 20 mg by mouth every evening.   Yes [provider]  sertraline (ZOLOFT) 25 MG tablet Take 25 mg by mouth daily.   Yes [provider]  spironolactone (ALDACTONE) 25 MG tablet Take 50 mg by mouth daily.    Yes [provider]  sulfamethoxazole-trimethoprim (BACTRIM DS) 800-160 MG tablet Take 1 tablet by  mouth 2 (two) times daily. 01/08/19  Yes Sable Feil, PA-C  traMADol (ULTRAM) 50 MG tablet Take 1 tablet (50 mg total) by mouth every 6 (six) hours as needed. 01/08/19 01/08/20 Yes Sable Feil, PA-C    Allergies: Allergies  Allergen Reactions  . Lisinopril Swelling    Review of Systems: Review of Systems  Constitutional: Negative for chills and fever.  Respiratory: Negative for cough.   Cardiovascular: Negative for chest pain.  Gastrointestinal: Negative for nausea and vomiting.  Skin: Negative for rash.    Physical Exam BP (!) 168/97   Pulse 66   Temp (!) 97.3 F (36.3 C)   Ht 6' (1.829 m)   Wt 181 lb 9.6 oz (82.4 kg)   SpO2 97%   BMI 24.63 kg/m  CONSTITUTIONAL: No acute distress HEENT:  Normocephalic, atraumatic, extraocular motion intact. RESPIRATORY:  Lungs are clear, and breath sounds are equal bilaterally. Normal respiratory effort without pathologic use of accessory muscles. CARDIOVASCULAR: Heart is regular without murmurs, gallops, or rubs. SKIN:  Right upper back cyst I&D sites well healed.  Has keloid scar on superior portion from prior I&D.   NEUROLOGIC:  Motor and sensation is grossly normal.  Cranial nerves are grossly intact. PSYCH:  Alert and oriented to person, place and time. Affect is normal.  Labs/Imaging: None recently  Assessment and Plan: This is a 68 y.o. male  with recent infected sebaceous cyst of upper back.  Patient has a history of prior I&Ds at the same site and wishes to have the cyst excised.  He wishes to have this done in the OR where he can be under anesthesia rather than doing this in the office awake.  I am happy to accommodate and we can do this in the OR.  Given his cardiac history, we will send clearance form to his cardiologist.  Also discussed with him that he should stop his ASA 5 days prior to surgery.  We will schedule him for 10/19 for right upper back sebaceous cyst excision.  Will include prior scars and keloid if possible  during our excision.  Discussed risks of bleeding, infection, and injury to surrounding structures.  He's willing to proceed.  He also understands that he would need to get tested for COVID prior to surgery.  [name] spent with the patient and care providers was 25 minutes, with more than 50% of the time spent counseling, educating, and coordinating care of the patient.     Howie Ill, MD [facility]

## 2019-02-05 NOTE — Progress Notes (Signed)
02/05/2019  History of Present Illness: Jesse Leider Sr. is a 68 y.o. male presenting for surgery discussion.  He was seen last on 10/2 at which time he wanted to do the cyst excision in the OR under anesthesia, but wanted referral to [facility].  However, now he is ok with doing the surgery at [facility] and presents today to discuss surgery and schedule.  Denies any new issues with the wound.  Past Medical History: Past Medical History:  Diagnosis Date  . Anginal pain ([place])   . Cardiomegaly 12/05/2011   /H&P  . CHF (congestive heart failure) (HCC)    Dr. Carolynn Serve, [facility]  . Hypertension      Past Surgical History: Past Surgical History:  Procedure Laterality Date  . NO PAST SURGERIES      Home Medications: Prior to Admission medications   Medication Sig Start Date End Date Taking? Authorizing Provider  aspirin EC 81 MG tablet Take 81 mg by mouth daily.   Yes [provider]  carvedilol (COREG) 12.5 MG tablet Take 12.5 mg by mouth 2 (two) times daily with a meal.   Yes [provider]  eplerenone (INSPRA) 50 MG tablet Take 1 tablet by mouth daily. 02/21/17  Yes [provider]  furosemide (LASIX) 20 MG tablet Take 20 mg by mouth daily.    Yes [provider]  loperamide (IMODIUM A-D) 2 MG tablet Take 1 tablet (2 mg total) by mouth 4 (four) times daily as needed for diarrhea or loose stools. 06/05/18  Yes Schaevitz, Randall An, MD  potassium chloride (K-DUR) 10 MEQ tablet Take 2 tablets (20 mEq total) by mouth daily. 06/05/18  Yes Orbie Pyo, MD  pravastatin (PRAVACHOL) 20 MG tablet Take 20 mg by mouth every evening.   Yes [provider]  sertraline (ZOLOFT) 25 MG tablet Take 25 mg by mouth daily.   Yes [provider]  spironolactone (ALDACTONE) 25 MG tablet Take 50 mg by mouth daily.    Yes [provider]  sulfamethoxazole-trimethoprim (BACTRIM DS) 800-160 MG tablet Take 1 tablet by  mouth 2 (two) times daily. 01/08/19  Yes Sable Feil, PA-C  traMADol (ULTRAM) 50 MG tablet Take 1 tablet (50 mg total) by mouth every 6 (six) hours as needed. 01/08/19 01/08/20 Yes Sable Feil, PA-C    Allergies: Allergies  Allergen Reactions  . Lisinopril Swelling    Review of Systems: Review of Systems  Constitutional: Negative for chills and fever.  Respiratory: Negative for cough.   Cardiovascular: Negative for chest pain.  Gastrointestinal: Negative for nausea and vomiting.  Skin: Negative for rash.    Physical Exam BP (!) 168/97   Pulse 66   Temp (!) 97.3 F (36.3 C)   Ht 6' (1.829 m)   Wt 181 lb 9.6 oz (82.4 kg)   SpO2 97%   BMI 24.63 kg/m  CONSTITUTIONAL: No acute distress HEENT:  Normocephalic, atraumatic, extraocular motion intact. RESPIRATORY:  Lungs are clear, and breath sounds are equal bilaterally. Normal respiratory effort without pathologic use of accessory muscles. CARDIOVASCULAR: Heart is regular without murmurs, gallops, or rubs. SKIN:  Right upper back cyst I&D sites well healed.  Has keloid scar on superior portion from prior I&D.   NEUROLOGIC:  Motor and sensation is grossly normal.  Cranial nerves are grossly intact. PSYCH:  Alert and oriented to person, place and time. Affect is normal.  Labs/Imaging: None recently  Assessment and Plan: This is a 68 y.o. male  with recent infected sebaceous cyst of upper back.  Patient has a history of prior I&Ds at the same site and wishes to have the cyst excised.  He wishes to have this done in the OR where he can be under anesthesia rather than doing this in the office awake.  I am happy to accommodate and we can do this in the OR.  Given his cardiac history, we will send clearance form to his cardiologist.  Also discussed with him that he should stop his ASA 5 days prior to surgery.  We will schedule him for 10/19 for right upper back sebaceous cyst excision.  Will include prior scars and keloid if possible  during our excision.  Discussed risks of bleeding, infection, and injury to surrounding structures.  He's willing to proceed.  He also understands that he would need to get tested for COVID prior to surgery.  [name] spent with the patient and care providers was 25 minutes, with more than 50% of the time spent counseling, educating, and coordinating care of the patient.     Jesse Galvan Jesse Arrants, MD [facility]    

## 2019-02-06 NOTE — Progress Notes (Signed)
Cardiac Clearance has been received from Dr Andree Elk office at [facility]. Patient at low risk. Dr Andree Elk prefers that patient have MAC anesthesia. Clearance has been faxed to Pre Admit testing.

## 2019-02-09 ENCOUNTER — Telehealth: Payer: Self-pay

## 2019-02-09 ENCOUNTER — Other Ambulatory Visit: Payer: Self-pay | Admitting: Surgery

## 2019-02-09 DIAGNOSIS — L089 Local infection of the skin and subcutaneous tissue, unspecified: Secondary | ICD-10-CM

## 2019-02-09 DIAGNOSIS — L723 Sebaceous cyst: Secondary | ICD-10-CM

## 2019-02-09 NOTE — Telephone Encounter (Signed)
Contacted patient and let him know that his pre admit office appt tomorrow 10/13 @ 12:45pm-[facility] between 8-10:30am and surgery is on 02/16/19 with Piscoya. He is aware of dates, times, and instructions.

## 2019-02-10 ENCOUNTER — Other Ambulatory Visit: Payer: Self-pay

## 2019-02-10 ENCOUNTER — Encounter
Admission: RE | Admit: 2019-02-10 | Discharge: 2019-02-10 | Disposition: A | Discharge status: Home / Self Care | Payer: Medicare HMO | Source: Ambulatory Visit | Attending: Surgery | Admitting: Surgery

## 2019-02-10 DIAGNOSIS — Z01812 Encounter for preprocedural laboratory examination: Secondary | ICD-10-CM | POA: Insufficient documentation

## 2019-02-10 HISTORY — DX: Cardiac murmur, unspecified: R01.1

## 2019-02-10 LAB — BASIC METABOLIC PANEL
Anion gap: 10 (ref 5–15)
BUN: 10 mg/dL (ref 8–23)
CO2: 24 mmol/L (ref 22–32)
Calcium: 9.3 mg/dL (ref 8.9–10.3)
Chloride: 103 mmol/L (ref 98–111)
Creatinine, Ser: 1.03 mg/dL (ref 0.61–1.24)
GFR calc Af Amer: >60 mL/min (ref >60)
GFR calc non Af Amer: >60 mL/min (ref >60)
Glucose, Bld: 102 mg/dL — ABNORMAL HIGH (ref 70–99)
Potassium: 3.4 mmol/L — ABNORMAL LOW (ref 3.5–5.1)
Sodium: 137 mmol/L (ref 135–145)

## 2019-02-10 LAB — CBC
HCT: 35.2 % — ABNORMAL LOW (ref 39.0–52.0)
Hemoglobin: 11.2 g/dL — ABNORMAL LOW (ref 13.0–17.0)
MCH: 26.1 pg (ref 26.0–34.0)
MCHC: 31.8 g/dL (ref 30.0–36.0)
MCV: 82.1 fL (ref 80.0–100.0)
Platelets: 227 10*3/uL (ref 150–400)
RBC: 4.29 MIL/uL (ref 4.22–5.81)
RDW: 15.4 % (ref 11.5–15.5)
WBC: 6 10*3/uL (ref 4.0–10.5)
nRBC: 0 % (ref 0.0–0.2)

## 2019-02-10 NOTE — Patient Instructions (Signed)
Your procedure is scheduled on: 02-16-19 MONDAY Report to Same Day Surgery 2nd floor medical mall [facility] Entrance-take elevator on left to 2nd floor.  Check in with surgery information desk.) To find out your arrival time please call [phone] between 1PM - 3PM on 02-13-19 FRIDAY  Remember: Instructions that are not followed completely may result in serious medical risk, up to and including death, or upon the discretion of your surgeon and anesthesiologist your surgery may need to be rescheduled.    _x___ 1. Do not eat food after midnight the night before your procedure. NO GUM OR CANDY AFTER MIDNIGHT. You may drink clear liquids up to 2 hours before you are scheduled to arrive at the hospital for your procedure.  Do not drink clear liquids within 2 hours of your scheduled arrival to the hospital.  Clear liquids include  --Water or Apple juice without pulp  --Gatorade  --Black Coffee or Clear Tea (No milk, no creamers, do not add anything to the coffee or Tea   ____Ensure clear carbohydrate drink on the way to the hospital for bariatric patients  ____Ensure clear carbohydrate drink 3 hours before surgery.     __x__ 2. No Alcohol for 24 hours before or after surgery.   __x__3. No Smoking or e-cigarettes for 24 prior to surgery.  Do not use any chewable tobacco products for at least 6 hour prior to surgery   ____  4. Bring all medications with you on the day of surgery if instructed.    __x__ 5. Notify your doctor if there is any change in your medical condition     (cold, fever, infections).    x___6. On the morning of surgery brush your teeth with toothpaste and water.  You may rinse your mouth with mouth wash if you wish.  Do not swallow any toothpaste or mouthwash.   Do not wear jewelry, make-up, hairpins, clips or nail polish.  Do not wear lotions, powders, or perfumes.   Do not shave 48 hours prior to surgery. Men may shave face and neck.  Do not bring valuables to  the hospital.    [facility] is not responsible for any belongings or valuables.               Contacts, dentures or bridgework may not be worn into surgery.  Leave your suitcase in the car. After surgery it may be brought to your room.  For patients admitted to the hospital, discharge time is determined by your treatment team.  _  Patients discharged the day of surgery will not be allowed to drive home.  You will need someone to drive you home and stay with you the night of your procedure.    Please read over the following fact sheets that you were given:   [facility] Preparing for Surgery   _x___ TAKE THE FOLLOWING MEDICATION THE MORNING OF SURGERY WITH A SMALL SIP OF WATER. These include:  1. COREG (CARVEDILOL)  2. MAGNESIUM OXIDE  3.  4.  5.  6.  ____Fleets enema or Magnesium Citrate as directed.   _x___ Use CHG Soap or sage wipes as directed on instruction sheet   ____ Use inhalers on the day of surgery and bring to hospital day of surgery  ____ Stop Metformin and Janumet 2 days prior to surgery.    ____ Take 1/2 of usual insulin dose the night before surgery and none on the morning surgery.   _x___ Follow recommendations from Cardiologist, Pulmonologist  or PCP regarding stopping Aspirin, Coumadin, Plavix ,Eliquis, Effient, or Pradaxa, and Pletal-PT INSTRUCTED BY CARDIOLOGIST TO STOP ASPIRIN 5 DAYS PRIOR TO SURGERY  X____Stop Anti-inflammatories such as Advil, Aleve, Ibuprofen, Motrin, Naproxen, Naprosyn, Goodies powders or aspirin products NOW-OK to take Tylenol    ____ Stop supplements until after surgery.    ____ Bring C-Pap to the hospital.

## 2019-02-10 NOTE — Pre-Procedure Instructions (Signed)
ECG 12 Lead6/20/2020 [facility] Component Name Value Ref Range  EKG Systolic BP  mmHg  EKG Diastolic BP  mmHg  EKG Ventricular Rate 74 BPM  EKG Atrial Rate 74 BPM  EKG P-R Interval 228 ms  EKG QRS Duration 102 ms  EKG Q-T Interval 396 ms  EKG QTC Calculation 439 ms  EKG Calculated P Axis 56 degrees  EKG Calculated R Axis -16 degrees  EKG Calculated T Axis 5 degrees  QTC Fredericia 425 ms  Result Narrative  SINUS RHYTHM WITH 1ST DEGREE AV BLOCK LEFT VENTRICULAR HYPERTROPHY WITH REPOLARIZATION ABNORMALITY ( Sokolow-Lyon , Cornell product ) WHEN COMPARED WITH ECG OF 30-Sep-2014 13:36, T WAVE INVERSION LESS EVIDENT IN ANTERIOR LEADS Confirmed by [facility] Records ([id number]) on 10/18/2018 6:53:11 PM  Other Result Information  Interface, Rad Results In - 10/18/2018  6:53 PM EDT SINUS RHYTHM WITH 1ST DEGREE AV BLOCK LEFT VENTRICULAR HYPERTROPHY WITH REPOLARIZATION ABNORMALITY ( Sokolow-Lyon , [employer] product ) WHEN COMPARED WITH ECG OF 30-Sep-2014 13:36, T WAVE INVERSION LESS EVIDENT IN ANTERIOR LEADS Confirmed by [facility] Records ([id number]) on 10/18/2018 6:53:11 PM  Status Results Details   Encounter Summary

## 2019-02-10 NOTE — Pre-Procedure Instructions (Signed)
CARDIAC CLEARNACE ON CHART-LOW RISK

## 2019-02-12 ENCOUNTER — Other Ambulatory Visit: Payer: Self-pay

## 2019-02-12 ENCOUNTER — Other Ambulatory Visit
Admission: RE | Admit: 2019-02-12 | Discharge: 2019-02-12 | Disposition: A | Discharge status: Home / Self Care | Payer: Medicare HMO | Source: Ambulatory Visit | Attending: Surgery | Admitting: Surgery

## 2019-02-12 DIAGNOSIS — Z20828 Contact with and (suspected) exposure to other viral communicable diseases: Secondary | ICD-10-CM | POA: Diagnosis not present

## 2019-02-12 DIAGNOSIS — Z01812 Encounter for preprocedural laboratory examination: Secondary | ICD-10-CM | POA: Insufficient documentation

## 2019-02-12 LAB — SARS CORONAVIRUS 2 (TAT 6-24 HRS): SARS Coronavirus 2: NEGATIVE

## 2019-02-16 ENCOUNTER — Ambulatory Visit: Payer: Medicare HMO | Admitting: Registered Nurse

## 2019-02-16 ENCOUNTER — Encounter
Admission: RE | Disposition: A | Discharge status: Home / Self Care | Payer: Self-pay | Source: Home / Self Care | Attending: Surgery

## 2019-02-16 ENCOUNTER — Ambulatory Visit
Admission: RE | Admit: 2019-02-16 | Discharge: 2019-02-16 | Disposition: A | Discharge status: Home / Self Care | Payer: Medicare HMO | Attending: Surgery | Admitting: Surgery

## 2019-02-16 ENCOUNTER — Encounter: Payer: Self-pay | Admitting: *Deleted

## 2019-02-16 ENCOUNTER — Other Ambulatory Visit: Payer: Self-pay

## 2019-02-16 DIAGNOSIS — L720 Epidermal cyst: Secondary | ICD-10-CM | POA: Diagnosis present

## 2019-02-16 DIAGNOSIS — Z888 Allergy status to other drugs, medicaments and biological substances status: Secondary | ICD-10-CM | POA: Diagnosis not present

## 2019-02-16 DIAGNOSIS — L089 Local infection of the skin and subcutaneous tissue, unspecified: Secondary | ICD-10-CM | POA: Diagnosis not present

## 2019-02-16 DIAGNOSIS — Z79899 Other long term (current) drug therapy: Secondary | ICD-10-CM | POA: Diagnosis not present

## 2019-02-16 DIAGNOSIS — L0889 Other specified local infections of the skin and subcutaneous tissue: Secondary | ICD-10-CM | POA: Insufficient documentation

## 2019-02-16 DIAGNOSIS — Z7982 Long term (current) use of aspirin: Secondary | ICD-10-CM | POA: Insufficient documentation

## 2019-02-16 DIAGNOSIS — I509 Heart failure, unspecified: Secondary | ICD-10-CM | POA: Insufficient documentation

## 2019-02-16 DIAGNOSIS — L723 Sebaceous cyst: Secondary | ICD-10-CM

## 2019-02-16 DIAGNOSIS — F17210 Nicotine dependence, cigarettes, uncomplicated: Secondary | ICD-10-CM | POA: Insufficient documentation

## 2019-02-16 DIAGNOSIS — I110 Hypertensive heart disease with heart failure: Secondary | ICD-10-CM | POA: Insufficient documentation

## 2019-02-16 HISTORY — PX: LIPOMA EXCISION: SHX5283

## 2019-02-16 SURGERY — EXCISION LIPOMA
Anesthesia: General

## 2019-02-16 MED ORDER — CEFAZOLIN SODIUM-DEXTROSE 2-4 GM/100ML-% IV SOLN
2.0000 g | INTRAVENOUS | Status: AC
  Administered 2019-02-16: 2 g via INTRAVENOUS

## 2019-02-16 MED ORDER — CEFAZOLIN SODIUM-DEXTROSE 2-4 GM/100ML-% IV SOLN
INTRAVENOUS | Status: AC
  Filled 2019-02-16: qty 100

## 2019-02-16 MED ORDER — LACTATED RINGERS IV SOLN
INTRAVENOUS | Status: DC
  Administered 2019-02-16: 09:00:00 via INTRAVENOUS

## 2019-02-16 MED ORDER — ACETAMINOPHEN 500 MG PO TABS
1000.0000 mg | ORAL_TABLET | ORAL | Status: AC
  Administered 2019-02-16: 1000 mg via ORAL

## 2019-02-16 MED ORDER — FENTANYL CITRATE (PF) 100 MCG/2ML IJ SOLN
INTRAMUSCULAR | Status: DC | PRN
  Administered 2019-02-16: 50 ug via INTRAVENOUS
  Administered 2019-02-16 (×2): 25 ug via INTRAVENOUS

## 2019-02-16 MED ORDER — MIDAZOLAM HCL 2 MG/2ML IJ SOLN
INTRAMUSCULAR | Status: DC | PRN
  Administered 2019-02-16: 2 mg via INTRAVENOUS

## 2019-02-16 MED ORDER — FENTANYL CITRATE (PF) 100 MCG/2ML IJ SOLN: 25.0000 ug | INTRAMUSCULAR | Status: DC | PRN

## 2019-02-16 MED ORDER — ONDANSETRON HCL 4 MG/2ML IJ SOLN
INTRAMUSCULAR | Status: DC | PRN
  Administered 2019-02-16: 4 mg via INTRAVENOUS

## 2019-02-16 MED ORDER — BUPIVACAINE-EPINEPHRINE 0.5% -1:200000 IJ SOLN
INTRAMUSCULAR | Status: DC | PRN
  Administered 2019-02-16: 30 mL

## 2019-02-16 MED ORDER — DEXAMETHASONE SODIUM PHOSPHATE 10 MG/ML IJ SOLN
INTRAMUSCULAR | Status: DC | PRN
  Administered 2019-02-16: 8 mg via INTRAVENOUS

## 2019-02-16 MED ORDER — ROCURONIUM BROMIDE 50 MG/5ML IV SOLN
INTRAVENOUS | Status: AC
  Filled 2019-02-16: qty 1

## 2019-02-16 MED ORDER — LIDOCAINE HCL (PF) 2 % IJ SOLN
INTRAMUSCULAR | Status: AC
  Filled 2019-02-16: qty 10

## 2019-02-16 MED ORDER — PROPOFOL 10 MG/ML IV BOLUS
INTRAVENOUS | Status: DC | PRN
  Administered 2019-02-16: 160 mg via INTRAVENOUS

## 2019-02-16 MED ORDER — SUGAMMADEX SODIUM 200 MG/2ML IV SOLN
INTRAVENOUS | Status: DC | PRN
  Administered 2019-02-16: 200 mg via INTRAVENOUS

## 2019-02-16 MED ORDER — GABAPENTIN 300 MG PO CAPS
300.0000 mg | ORAL_CAPSULE | ORAL | Status: AC
  Administered 2019-02-16: 08:00:00 300 mg via ORAL

## 2019-02-16 MED ORDER — FAMOTIDINE 20 MG PO TABS
20.0000 mg | ORAL_TABLET | Freq: Once | ORAL | Status: AC
  Administered 2019-02-16: 20 mg via ORAL

## 2019-02-16 MED ORDER — ROCURONIUM BROMIDE 100 MG/10ML IV SOLN
INTRAVENOUS | Status: DC | PRN
  Administered 2019-02-16: 5 mg via INTRAVENOUS
  Administered 2019-02-16: 25 mg via INTRAVENOUS

## 2019-02-16 MED ORDER — SUCCINYLCHOLINE CHLORIDE 20 MG/ML IJ SOLN
INTRAMUSCULAR | Status: DC | PRN
  Administered 2019-02-16: 100 mg via INTRAVENOUS

## 2019-02-16 MED ORDER — FENTANYL CITRATE (PF) 100 MCG/2ML IJ SOLN
INTRAMUSCULAR | Status: AC
  Filled 2019-02-16: qty 2

## 2019-02-16 MED ORDER — MIDAZOLAM HCL 2 MG/2ML IJ SOLN
INTRAMUSCULAR | Status: AC
  Filled 2019-02-16: qty 2

## 2019-02-16 MED ORDER — LIDOCAINE HCL (CARDIAC) PF 100 MG/5ML IV SOSY
PREFILLED_SYRINGE | INTRAVENOUS | Status: DC | PRN
  Administered 2019-02-16: 100 mg via INTRAVENOUS

## 2019-02-16 MED ORDER — OXYCODONE HCL 5 MG PO TABS: 5.0000 mg | ORAL_TABLET | ORAL | 0 refills | Status: AC | PRN

## 2019-02-16 MED ORDER — BUPIVACAINE HCL (PF) 0.5 % IJ SOLN
INTRAMUSCULAR | Status: AC
  Filled 2019-02-16: qty 30

## 2019-02-16 MED ORDER — ACETAMINOPHEN 500 MG PO TABS
ORAL_TABLET | ORAL | Status: AC
  Filled 2019-02-16: qty 2

## 2019-02-16 MED ORDER — IBUPROFEN 600 MG PO TABS: 600.0000 mg | ORAL_TABLET | Freq: Three times a day (TID) | ORAL | 0 refills | Status: AC | PRN

## 2019-02-16 MED ORDER — SUCCINYLCHOLINE CHLORIDE 20 MG/ML IJ SOLN
INTRAMUSCULAR | Status: AC
  Filled 2019-02-16: qty 1

## 2019-02-16 MED ORDER — PHENYLEPHRINE HCL (PRESSORS) 10 MG/ML IV SOLN
INTRAVENOUS | Status: DC | PRN
  Administered 2019-02-16 (×4): 100 ug via INTRAVENOUS

## 2019-02-16 MED ORDER — GABAPENTIN 300 MG PO CAPS
ORAL_CAPSULE | ORAL | Status: AC
  Filled 2019-02-16: qty 1

## 2019-02-16 MED ORDER — BUPIVACAINE LIPOSOME 1.3 % IJ SUSP
INTRAMUSCULAR | Status: AC
  Filled 2019-02-16: qty 20

## 2019-02-16 MED ORDER — FAMOTIDINE 20 MG PO TABS
ORAL_TABLET | ORAL | Status: AC
  Filled 2019-02-16: qty 1

## 2019-02-16 MED ORDER — ONDANSETRON HCL 4 MG/2ML IJ SOLN: 4.0000 mg | Freq: Once | INTRAMUSCULAR | Status: DC | PRN

## 2019-02-16 MED ORDER — CHLORHEXIDINE GLUCONATE CLOTH 2 % EX PADS: 6.0000 | MEDICATED_PAD | Freq: Once | CUTANEOUS | Status: DC

## 2019-02-16 MED ORDER — PROPOFOL 10 MG/ML IV BOLUS
INTRAVENOUS | Status: AC
  Filled 2019-02-16: qty 20

## 2019-02-16 MED ORDER — SODIUM CHLORIDE FLUSH 0.9 % IV SOLN
INTRAVENOUS | Status: AC
  Filled 2019-02-16: qty 10

## 2019-02-16 MED ORDER — EPINEPHRINE PF 1 MG/ML IJ SOLN
INTRAMUSCULAR | Status: AC
  Filled 2019-02-16: qty 1

## 2019-02-16 SURGICAL SUPPLY — 26 items
CHLORAPREP W/TINT 26 (MISCELLANEOUS) ×3 IMPLANT
COVER WAND RF STERILE (DRAPES) ×3 IMPLANT
DERMABOND ADVANCED (GAUZE/BANDAGES/DRESSINGS) ×2
DERMABOND ADVANCED .7 DNX12 (GAUZE/BANDAGES/DRESSINGS) ×1 IMPLANT
DRAPE 3/4 80X56 (DRAPES) ×6 IMPLANT
DRAPE LAPAROTOMY 100X77 ABD (DRAPES) ×3 IMPLANT
ELECT CAUTERY BLADE 6.4 (BLADE) ×3 IMPLANT
ELECT REM PT RETURN 9FT ADLT (ELECTROSURGICAL) ×3
ELECTRODE REM PT RTRN 9FT ADLT (ELECTROSURGICAL) ×1 IMPLANT
GLOVE SURG SYN 7.0 (GLOVE) ×3 IMPLANT
GLOVE SURG SYN 7.5  E (GLOVE) ×2
GLOVE SURG SYN 7.5 E (GLOVE) ×1 IMPLANT
GOWN STRL REUS W/ TWL LRG LVL3 (GOWN DISPOSABLE) ×2 IMPLANT
GOWN STRL REUS W/TWL LRG LVL3 (GOWN DISPOSABLE) ×4
KIT TURNOVER KIT A (KITS) ×3 IMPLANT
LABEL OR SOLS (LABEL) ×3 IMPLANT
NEEDLE HYPO 22GX1.5 SAFETY (NEEDLE) ×3 IMPLANT
NS IRRIG 1000ML POUR BTL (IV SOLUTION) ×3 IMPLANT
PACK BASIN MINOR ARMC (MISCELLANEOUS) ×3 IMPLANT
SUT MNCRL 4-0 (SUTURE) ×2
SUT MNCRL 4-0 27XMFL (SUTURE) ×1
SUT VIC AB 0 SH 27 (SUTURE) ×6 IMPLANT
SUT VIC AB 3-0 SH 27 (SUTURE) ×4
SUT VIC AB 3-0 SH 27X BRD (SUTURE) ×2 IMPLANT
SUTURE MNCRL 4-0 27XMF (SUTURE) ×1 IMPLANT
SYR 30ML LL (SYRINGE) ×3 IMPLANT

## 2019-02-16 NOTE — Op Note (Signed)
  Procedure Date:  02/16/2019  Pre-operative Diagnosis:  Right upper back infected sebaceous cyst  Post-operative Diagnosis:  Right upper back infected sebaceous cyst  Procedure:  Excision of right upper back sebaceous cyst  Surgeon:  Melvyn Neth, MD  Anesthesia:  General endotracheal  Estimated Blood Loss:  5 ml  Specimens:  Right upper back cyst  Complications:  None  Indications for Procedure:  This is a 67 y.o. male with diagnosis of a symptomatic right upper back cyst, which has gotten infected before and has required I&D procedures in the past.  The patient wishes to have this excised. The risks of bleeding, abscess or infection, injury to surrounding structures, and need for further procedures were all discussed with the patient and he was willing to proceed.  Description of Procedure: The patient was correctly identified in the preoperative area and brought into the operating room.  The patient was placed supine with VTE prophylaxis in place.  Appropriate time-outs were performed.  Anesthesia was induced and the patient was intubated.  Appropriate antibiotics were infused.  The patient was then placed in prone position.  The patient's right upper back was prepped and draped in usual sterile fashion.  An elliptical 5 cm incision was made over the cyst, incorporating prior I&D scar as well as a keloid from prior I&D.  Cautery was used to dissect down the subcutaneous tissue.  Skin flaps were created using cautery as well, and then the cyst and skin were excised en bloc using cautery.  It was sent off to pathology.  The cavity was then irrigated and hemostasis was assured with cautery.  Local anesthetic was infused intradermally.  The wound was then closed using 0 Vicryl sutures to approximate scarpas fascia and the deep portion of the dermis.  The rest was closed with 3-0 Vicryl and 4-0 Monocryl.  The incision was cleaned and sealed with DermaBond.  The patient was then placed  back in supine position, emerged from anesthesia, extubated, and brought to the recovery room for further management.    The patient tolerated the procedure well and all counts were correct at the end of the case.   Melvyn Neth, MD

## 2019-02-16 NOTE — Anesthesia Preprocedure Evaluation (Signed)
Anesthesia Evaluation  Patient identified by MRN, date of birth, ID band Patient awake    Reviewed: Allergy & Precautions, NPO status , Patient's Chart, lab work & pertinent test results  History of Anesthesia Complications Negative for: history of anesthetic complications  Airway Mallampati: III  TM Distance: >3 FB Neck ROM: Full    Dental  (+) Poor Dentition, Missing   Pulmonary neg sleep apnea, neg COPD, Current Smoker and Patient abstained from smoking.,    breath sounds clear to auscultation- rhonchi (-) wheezing      Cardiovascular hypertension, Pt. on medications (-) angina+CHF (EF 35%)  (-) CAD, (-) Past MI and (-) Cardiac Stents  Rhythm:Regular Rate:Normal - Systolic murmurs and - Diastolic murmurs Echo 3818: Left ventricular hypertrophy - mild  Moderately to severely decreased left ventricular systolic function,  ejection fraction 30 to 29%  Diastolic dysfunction - grade I (normal filling pressures)  Degenerative mitral valve disease  Mitral regurgitation - mild to moderate  Dilated left atrium - mild  Dilated ascending aorta (see below)  Aortic sclerosis  Pulmonary regurgitation (mild)  Normal right ventricular systolic function   Neuro/Psych neg Seizures negative neurological ROS  negative psych ROS   GI/Hepatic negative GI ROS, Neg liver ROS,   Endo/Other  neg diabetes  Renal/GU negative Renal ROS     Musculoskeletal negative musculoskeletal ROS (+)   Abdominal (+) - obese,   Peds  Hematology negative hematology ROS (+)   Anesthesia Other Findings Past Medical History: No date: Anginal pain ([place]) 12/05/2011: Cardiomegaly     Comment:  /H&P No date: CHF (congestive heart failure) (HCC)     Comment:  Dr. Carolynn Serve, [facility] No date: Heart murmur     Comment:  asymptomatic No date: Hypertension   Reproductive/Obstetrics                              Anesthesia Physical Anesthesia Plan  ASA: III  Anesthesia Plan: General   Post-op Pain Management:    Induction: Intravenous  PONV Risk Score and Plan: 0 and Ondansetron  Airway Management Planned: Oral ETT  Additional Equipment:   Intra-op Plan:   Post-operative Plan: Extubation in OR  Informed Consent: I have reviewed the patients History and Physical, chart, labs and discussed the procedure including the risks, benefits and alternatives for the proposed anesthesia with the patient or authorized representative who has indicated his/her understanding and acceptance.     Dental advisory given  Plan Discussed with: CRNA and Anesthesiologist  Anesthesia Plan Comments:         Anesthesia Quick Evaluation

## 2019-02-16 NOTE — Transfer of Care (Signed)
Immediate Anesthesia Transfer of Care Note  Patient: Jesse Roch Sr.  Procedure(s) Performed: EXCISION LIPOMA-sebaceous cyst upper back (N/A )  Patient Location: PACU  Anesthesia Type:General  Level of Consciousness: sedated  Airway & Oxygen Therapy: Patient Spontanous Breathing and Patient connected to face mask oxygen  Post-op Assessment: Report given to RN and Post -op Vital signs reviewed and stable  Post vital signs: Reviewed and stable  Last Vitals:  Vitals Value Taken Time  BP 137/113 02/16/19 1101  Temp    Pulse 59 02/16/19 1102  Resp 26 02/16/19 1102  SpO2 96 % 02/16/19 1102  Vitals shown include unvalidated device data.  Last Pain:  Vitals:   02/16/19 0813  TempSrc: Tympanic  PainSc: 0-No pain      Patients Stated Pain Goal: 0 (77/41/[id number])  Complications: No apparent anesthesia complications

## 2019-02-16 NOTE — Anesthesia Procedure Notes (Addendum)
Procedure Name: Intubation Date/Time: 02/16/2019 9:38 AM Performed by: Aline Brochure, CRNA Pre-anesthesia Checklist: Patient identified, Emergency Drugs available, Suction available and Patient being monitored Patient Re-evaluated:Patient Re-evaluated prior to induction Oxygen Delivery Method: Circle system utilized Preoxygenation: Pre-oxygenation with 100% oxygen Induction Type: IV induction Ventilation: Mask ventilation without difficulty Laryngoscope Size: McGraph and 4 Grade View: Grade I Tube type: Oral Tube size: 7.5 mm Number of attempts: 1 Airway Equipment and Method: Stylet and Video-laryngoscopy Placement Confirmation: ETT inserted through vocal cords under direct vision,  positive ETCO2 and breath sounds checked- equal and bilateral Secured at: 22 cm Tube secured with: Tape Dental Injury: Teeth and Oropharynx as per pre-operative assessment

## 2019-02-16 NOTE — Anesthesia Post-op Follow-up Note (Signed)
Anesthesia QCDR form completed.        

## 2019-02-16 NOTE — Discharge Instructions (Signed)

## 2019-02-16 NOTE — Interval H&P Note (Signed)
History and Physical Interval Note:  02/16/2019 9:17 AM  Jesse Roch Sr.  has presented today for surgery, with the diagnosis of sebaceous cyst upper back.  The various methods of treatment have been discussed with the patient and family. After consideration of risks, benefits and other options for treatment, the patient has consented to  Procedure(s): EXCISION LIPOMA-sebaceous cyst upper back (N/A) as a surgical intervention.  The patient's history has been reviewed, patient examined, no change in status, stable for surgery.  I have reviewed the patient's chart and labs.  Questions were answered to the patient's satisfaction.     Gwenn Teodoro

## 2019-02-17 ENCOUNTER — Encounter: Payer: Self-pay | Admitting: Surgery

## 2019-02-17 LAB — SURGICAL PATHOLOGY

## 2019-02-17 NOTE — Anesthesia Postprocedure Evaluation (Signed)
Anesthesia Post Note  Patient: Jesse Galvan.  Procedure(s) Performed: EXCISION LIPOMA-sebaceous cyst upper back (N/A )  Patient location during evaluation: PACU Anesthesia Type: General Level of consciousness: awake and alert and oriented Pain management: pain level controlled Vital Signs Assessment: post-procedure vital signs reviewed and stable Respiratory status: spontaneous breathing, nonlabored ventilation and respiratory function stable Cardiovascular status: blood pressure returned to baseline and stable Postop Assessment: no signs of nausea or vomiting Anesthetic complications: no     Last Vitals:  Vitals:   02/16/19 1146 02/16/19 1240  BP: (!) 161/84 (!) 157/74  Pulse: (!) 52   Resp: 20 18  Temp: (!) 36.1 C   SpO2: 95% 98%    Last Pain:  Vitals:   02/16/19 1146  TempSrc: Temporal  PainSc: 0-No pain                 Ruthmary Occhipinti

## 2019-03-05 ENCOUNTER — Encounter: Payer: Medicare HMO | Admitting: Physician Assistant

## 2021-02-19 ENCOUNTER — Emergency Department: Payer: Medicare HMO

## 2021-02-19 ENCOUNTER — Emergency Department
Admission: EM | Admit: 2021-02-19 | Discharge: 2021-02-19 | Disposition: A | Discharge status: Home / Self Care | Payer: Medicare HMO | Attending: Emergency Medicine | Admitting: Emergency Medicine

## 2021-02-19 ENCOUNTER — Encounter: Payer: Self-pay | Admitting: Radiology

## 2021-02-19 DIAGNOSIS — Z041 Encounter for examination and observation following transport accident: Secondary | ICD-10-CM | POA: Insufficient documentation

## 2021-02-19 DIAGNOSIS — Y92410 Unspecified street and highway as the place of occurrence of the external cause: Secondary | ICD-10-CM | POA: Diagnosis not present

## 2021-02-19 DIAGNOSIS — F17210 Nicotine dependence, cigarettes, uncomplicated: Secondary | ICD-10-CM | POA: Insufficient documentation

## 2021-02-19 DIAGNOSIS — Z23 Encounter for immunization: Secondary | ICD-10-CM | POA: Diagnosis not present

## 2021-02-19 DIAGNOSIS — R079 Chest pain, unspecified: Secondary | ICD-10-CM | POA: Diagnosis present

## 2021-02-19 DIAGNOSIS — R1032 Left lower quadrant pain: Secondary | ICD-10-CM | POA: Insufficient documentation

## 2021-02-19 DIAGNOSIS — I5022 Chronic systolic (congestive) heart failure: Secondary | ICD-10-CM | POA: Insufficient documentation

## 2021-02-19 DIAGNOSIS — S299XXA Unspecified injury of thorax, initial encounter: Secondary | ICD-10-CM | POA: Diagnosis not present

## 2021-02-19 DIAGNOSIS — S0990XA Unspecified injury of head, initial encounter: Secondary | ICD-10-CM | POA: Diagnosis present

## 2021-02-19 DIAGNOSIS — I110 Hypertensive heart disease with heart failure: Secondary | ICD-10-CM | POA: Insufficient documentation

## 2021-02-19 DIAGNOSIS — N289 Disorder of kidney and ureter, unspecified: Secondary | ICD-10-CM | POA: Diagnosis not present

## 2021-02-19 LAB — COMPREHENSIVE METABOLIC PANEL
ALT: 13 U/L (ref 0–44)
AST: 23 U/L (ref 15–41)
Albumin: 3.8 g/dL (ref 3.5–5.0)
Alkaline Phosphatase: 53 U/L (ref 38–126)
Anion gap: 10 (ref 5–15)
BUN: 10 mg/dL (ref 8–23)
CO2: 25 mmol/L (ref 22–32)
Calcium: 8.9 mg/dL (ref 8.9–10.3)
Chloride: 102 mmol/L (ref 98–111)
Creatinine, Ser: 1.03 mg/dL (ref 0.61–1.24)
GFR, Estimated: >60 mL/min (ref >60)
Glucose, Bld: 108 mg/dL — ABNORMAL HIGH (ref 70–99)
Potassium: 2.9 mmol/L — ABNORMAL LOW (ref 3.5–5.1)
Sodium: 137 mmol/L (ref 135–145)
Total Bilirubin: 1.1 mg/dL (ref 0.3–1.2)
Total Protein: 7 g/dL (ref 6.5–8.1)

## 2021-02-19 LAB — CBC WITH DIFFERENTIAL/PLATELET
Abs Immature Granulocytes: 0.02 10*3/uL (ref 0.00–0.07)
Basophils Absolute: 0 10*3/uL (ref 0.0–0.1)
Basophils Relative: 0 %
Eosinophils Absolute: 0.1 10*3/uL (ref 0.0–0.5)
Eosinophils Relative: 1 %
HCT: 36.5 % — ABNORMAL LOW (ref 39.0–52.0)
Hemoglobin: 12 g/dL — ABNORMAL LOW (ref 13.0–17.0)
Immature Granulocytes: 0 %
Lymphocytes Relative: 42 %
Lymphs Abs: 3.4 10*3/uL (ref 0.7–4.0)
MCH: 26.4 pg (ref 26.0–34.0)
MCHC: 32.9 g/dL (ref 30.0–36.0)
MCV: 80.2 fL (ref 80.0–100.0)
Monocytes Absolute: 0.8 10*3/uL (ref 0.1–1.0)
Monocytes Relative: 10 %
Neutro Abs: 3.8 10*3/uL (ref 1.7–7.7)
Neutrophils Relative %: 47 %
Platelets: 254 10*3/uL (ref 150–400)
RBC: 4.55 MIL/uL (ref 4.22–5.81)
RDW: 14.4 % (ref 11.5–15.5)
WBC: 8.2 10*3/uL (ref 4.0–10.5)
nRBC: 0 % (ref 0.0–0.2)

## 2021-02-19 LAB — URINALYSIS, ROUTINE W REFLEX MICROSCOPIC
Bilirubin Urine: NEGATIVE
Glucose, UA: NEGATIVE mg/dL
Ketones, ur: NEGATIVE mg/dL
Leukocytes,Ua: NEGATIVE
Nitrite: NEGATIVE
Protein, ur: 100 mg/dL — AB
Specific Gravity, Urine: 1.01 (ref 1.005–1.030)
pH: 6 (ref 5.0–8.0)

## 2021-02-19 LAB — TROPONIN I (HIGH SENSITIVITY)
Troponin I (High Sensitivity): 10 ng/L (ref <18)
Troponin I (High Sensitivity): 11 ng/L (ref <18)

## 2021-02-19 MED ORDER — POTASSIUM CHLORIDE CRYS ER 20 MEQ PO TBCR: 20.0000 meq | EXTENDED_RELEASE_TABLET | Freq: Two times a day (BID) | ORAL | 0 refills | Status: AC

## 2021-02-19 MED ORDER — POTASSIUM CHLORIDE CRYS ER 20 MEQ PO TBCR
40.0000 meq | EXTENDED_RELEASE_TABLET | Freq: Once | ORAL | Status: AC
  Administered 2021-02-19: 40 meq via ORAL
  Filled 2021-02-19: qty 2

## 2021-02-19 MED ORDER — IOHEXOL 300 MG/ML  SOLN
100.0000 mL | Freq: Once | INTRAMUSCULAR | Status: AC | PRN
  Administered 2021-02-19: 100 mL via INTRAVENOUS

## 2021-02-19 MED ORDER — ONDANSETRON HCL 4 MG/2ML IJ SOLN
4.0000 mg | Freq: Once | INTRAMUSCULAR | Status: AC
  Administered 2021-02-19: 4 mg via INTRAVENOUS
  Filled 2021-02-19: qty 2

## 2021-02-19 MED ORDER — FENTANYL CITRATE PF 50 MCG/ML IJ SOSY
50.0000 ug | PREFILLED_SYRINGE | Freq: Once | INTRAMUSCULAR | Status: AC
Start: 2021-02-19 — End: 2021-02-19
  Administered 2021-02-19: 50 ug via INTRAVENOUS
  Filled 2021-02-19: qty 1

## 2021-02-19 MED ORDER — TETANUS-DIPHTH-ACELL PERTUSSIS 5-2.5-18.5 LF-MCG/0.5 IM SUSY
0.5000 mL | PREFILLED_SYRINGE | Freq: Once | INTRAMUSCULAR | Status: AC
  Administered 2021-02-19: 0.5 mL via INTRAMUSCULAR
  Filled 2021-02-19: qty 0.5

## 2021-02-19 NOTE — ED Notes (Signed)
Patient given discharge instructions, all questions answered. Patient in possession of all belongings, directed to the discharge area  
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
## 2021-02-19 NOTE — ED Notes (Signed)
Patient ambulatory to and from the restroom without assistance.  
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
## 2022-09-16 ENCOUNTER — Other Ambulatory Visit: Payer: Self-pay

## 2022-09-16 ENCOUNTER — Encounter: Payer: Self-pay | Admitting: Emergency Medicine

## 2022-09-16 ENCOUNTER — Emergency Department: Payer: Medicare HMO

## 2022-09-16 ENCOUNTER — Emergency Department
Admission: EM | Admit: 2022-09-16 | Discharge: 2022-09-16 | Disposition: A | Discharge status: Home / Self Care | Payer: Medicare HMO | Attending: Emergency Medicine | Admitting: Emergency Medicine

## 2022-09-16 DIAGNOSIS — I509 Heart failure, unspecified: Secondary | ICD-10-CM | POA: Insufficient documentation

## 2022-09-16 DIAGNOSIS — R079 Chest pain, unspecified: Secondary | ICD-10-CM | POA: Diagnosis present

## 2022-09-16 DIAGNOSIS — I110 Hypertensive heart disease with heart failure: Secondary | ICD-10-CM | POA: Diagnosis not present

## 2022-09-16 LAB — TROPONIN I (HIGH SENSITIVITY)
Troponin I (High Sensitivity): 7 ng/L (ref <18)
Troponin I (High Sensitivity): 7 ng/L (ref <18)

## 2022-09-16 LAB — CBC
HCT: 36.3 % — ABNORMAL LOW (ref 39.0–52.0)
Hemoglobin: 11.3 g/dL — ABNORMAL LOW (ref 13.0–17.0)
MCH: 24.6 pg — ABNORMAL LOW (ref 26.0–34.0)
MCHC: 31.1 g/dL (ref 30.0–36.0)
MCV: 78.9 fL — ABNORMAL LOW (ref 80.0–100.0)
Platelets: 223 10*3/uL (ref 150–400)
RBC: 4.6 MIL/uL (ref 4.22–5.81)
RDW: 15.8 % — ABNORMAL HIGH (ref 11.5–15.5)
WBC: 5.2 10*3/uL (ref 4.0–10.5)
nRBC: 0 % (ref 0.0–0.2)

## 2022-09-16 LAB — BASIC METABOLIC PANEL
Anion gap: 8 (ref 5–15)
BUN: 14 mg/dL (ref 8–23)
CO2: 25 mmol/L (ref 22–32)
Calcium: 9.2 mg/dL (ref 8.9–10.3)
Chloride: 104 mmol/L (ref 98–111)
Creatinine, Ser: 1.26 mg/dL — ABNORMAL HIGH (ref 0.61–1.24)
GFR, Estimated: >60 mL/min (ref >60)
Glucose, Bld: 100 mg/dL — ABNORMAL HIGH (ref 70–99)
Potassium: 3.6 mmol/L (ref 3.5–5.1)
Sodium: 137 mmol/L (ref 135–145)

## 2022-09-16 NOTE — ED Notes (Signed)
Pt IV removed
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
## 2022-09-16 NOTE — ED Triage Notes (Signed)
Pt to ED via [facility] from home for chest pain. PT states that pain started about 1 hour PTA. Pt states that his pain is easing off now. PT is currently in NAD>
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
## 2022-09-28 ENCOUNTER — Emergency Department: Payer: Medicare HMO

## 2022-09-28 ENCOUNTER — Other Ambulatory Visit: Payer: Self-pay

## 2022-09-28 ENCOUNTER — Emergency Department
Admission: EM | Admit: 2022-09-28 | Discharge: 2022-09-28 | Disposition: A | Discharge status: Home / Self Care | Payer: Medicare HMO | Attending: Emergency Medicine | Admitting: Emergency Medicine

## 2022-09-28 DIAGNOSIS — W01198A Fall on same level from slipping, tripping and stumbling with subsequent striking against other object, initial encounter: Secondary | ICD-10-CM | POA: Diagnosis not present

## 2022-09-28 DIAGNOSIS — Z7982 Long term (current) use of aspirin: Secondary | ICD-10-CM | POA: Diagnosis not present

## 2022-09-28 DIAGNOSIS — Y92512 Supermarket, store or market as the place of occurrence of the external cause: Secondary | ICD-10-CM | POA: Diagnosis not present

## 2022-09-28 DIAGNOSIS — I6789 Other cerebrovascular disease: Secondary | ICD-10-CM | POA: Diagnosis not present

## 2022-09-28 DIAGNOSIS — S0083XA Contusion of other part of head, initial encounter: Secondary | ICD-10-CM

## 2022-09-28 DIAGNOSIS — S01511A Laceration without foreign body of lip, initial encounter: Secondary | ICD-10-CM | POA: Diagnosis not present

## 2022-09-28 DIAGNOSIS — Z79899 Other long term (current) drug therapy: Secondary | ICD-10-CM | POA: Insufficient documentation

## 2022-09-28 DIAGNOSIS — S0993XA Unspecified injury of face, initial encounter: Secondary | ICD-10-CM | POA: Diagnosis present

## 2022-09-28 MED ORDER — LIDOCAINE-EPINEPHRINE-TETRACAINE (LET) TOPICAL GEL
3.0000 mL | Freq: Once | TOPICAL | Status: AC
  Administered 2022-09-28: 3 mL via TOPICAL
  Filled 2022-09-28: qty 3

## 2022-09-28 NOTE — Discharge Instructions (Signed)
Please rest ice and elevate the face.  Your CT scan showed no fractures.  Please follow-up with primary care provider, walk-in clinic or urgent care in 5 to 6 days for suture removal.  Return to the ER for any worsening symptoms or any urgent changes in your health
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
## 2022-09-28 NOTE — ED Provider Notes (Signed)
[place] EMERGENCY DEPARTMENT AT [facility] Provider Note   CSN: [id number] Arrival date & time: 09/28/22  1415     History  Chief Complaint  Patient presents with   Jesse Friendly Sr. is a 72 y.o. male.  Presents to the emergency department for evaluation of a fall earlier today.  Patient states she tripped coming out of a store, landed on concrete.  Hit the right side of his face on concrete and suffered a laceration to the right lower lip.  Denies LOC, headache does have right-sided facial pain along the inferior orbital rim and right maxillary sinus along with a right lower lip laceration.  He denies any neck pain numbness tingling radicular symptoms.  No other pain or discomfort throughout the lower extremities.  He is ambulatory.  HPI     Home Medications Prior to Admission medications   Medication Sig Start Date End Date Taking? Authorizing Provider  aspirin EC 81 MG tablet Take 81 mg by mouth daily.    [provider]  atorvastatin (LIPITOR) 40 MG tablet Take 40 mg by mouth daily. 12/08/20   [provider]  carvedilol (COREG) 12.5 MG tablet Take 12.5 mg by mouth 2 (two) times daily with a meal.    [provider]  eplerenone (INSPRA) 50 MG tablet Take 100 mg by mouth every morning.  02/21/17   [provider]  furosemide (LASIX) 20 MG tablet Take 20 mg by mouth daily.     [provider]  ibuprofen (ADVIL) 600 MG tablet Take 1 tablet (600 mg total) by mouth every 8 (eight) hours as needed for headache, mild pain or moderate pain. 02/16/19   Henrene Dodge, MD  magnesium oxide (MAG-OX) 400 MG tablet Take 400 mg by mouth every morning.     [provider]  oxyCODONE (OXY IR/ROXICODONE) 5 MG immediate release tablet Take 1 tablet (5 mg total) by mouth every 4 (four) hours as needed for severe pain. 02/16/19   Piscoya, Elita Quick, MD  potassium chloride SA (KLOR-CON) 20 MEQ tablet Take 1 tablet (20 mEq total) by  mouth 2 (two) times daily for 3 days. 02/19/21 02/22/21  Concha Se, MD  pravastatin (PRAVACHOL) 20 MG tablet Take 20 mg by mouth every evening.    [provider]  sacubitril-valsartan (ENTRESTO) 49-51 MG Take 1 tablet by mouth 2 (two) times daily.    [provider]      Allergies    Lisinopril    Review of Systems   Review of Systems  Physical Exam Updated Vital Signs BP 103/70   Pulse 68   Temp 97.8 F (36.6 C) (Oral)   Resp 18   SpO2 98%  Physical Exam Constitutional:      Appearance: He is well-developed.  HENT:     Head: Normocephalic.     Comments: Tender along the right mandible, maxillary sinus, inferior orbital rim.  EOM normal.  He does have a 1.5 cm laceration along the right lower lip.  No intraoral laceration or trauma.  No trismus. Eyes:     Extraocular Movements: Extraocular movements intact.     Conjunctiva/sclera: Conjunctivae normal.     Pupils: Pupils are equal, round, and reactive to light.  Cardiovascular:     Rate and Rhythm: Normal rate.  Pulmonary:     Effort: Pulmonary effort is normal. No respiratory distress.  Abdominal:     General: Bowel sounds are normal.     Tenderness: There  is no abdominal tenderness. There is no right CVA tenderness, left CVA tenderness or guarding.  Musculoskeletal:        General: Normal range of motion.     Cervical back: Normal range of motion.  Skin:    General: Skin is warm.     Findings: No rash.  Neurological:     Mental Status: He is alert and oriented to person, place, and time.  Psychiatric:        Behavior: Behavior normal.        Thought Content: Thought content normal.     ED Results / Procedures / Treatments   Labs (all labs ordered are listed, but only abnormal results are displayed) Labs Reviewed - No data to display  EKG EKG Interpretation  Date/Time:  Friday Sep 28 2022 14:28:53 EDT Ventricular Rate:  65 PR Interval:  272 QRS Duration: 92 QT Interval:  382 QTC  Calculation: 397 R Axis:   4 Text Interpretation: Sinus rhythm with 1st degree A-V block ST & T wave abnormality, consider lateral ischemia Abnormal ECG When compared with ECG of 16-Sep-2022 14:48, No significant change was found Confirmed by UNCONFIRMED, DOCTOR ([id number]), editor Lonell Face ([id number]) on 09/28/2022 3:19:26 PM  Radiology CT Head Wo Contrast  Result Date: 09/28/2022 CLINICAL DATA:  Trauma, fall EXAM: CT HEAD WITHOUT CONTRAST TECHNIQUE: Contiguous axial images were obtained from the base of the skull through the vertex without intravenous contrast. RADIATION DOSE REDUCTION: This exam was performed according to the departmental dose-optimization program which includes automated exposure control, adjustment of the mA and/or kV according to patient size and/or use of iterative reconstruction technique. COMPARISON:  02/19/2021 FINDINGS: Brain: No acute intracranial findings are seen. There are no signs of bleeding within the cranium. Calcifications are seen in basal ganglia. Cortical sulci are prominent. There is decreased density in periventricular and subcortical white matter. Vascular: Scattered arterial calcifications are seen. Skull: No acute findings are seen. Sinuses/Orbits: No acute findings are seen. Other: None. IMPRESSION: No acute intracranial findings are seen. Atrophy. Small-vessel disease. Electronically Signed   By: Ernie Avena M.D.   On: 09/28/2022 15:28    Procedures .Marland KitchenLaceration Repair  Date/Time: 09/28/2022 4:39 PM  Performed by: Evon Slack, PA-C Authorized by: Evon Slack, PA-C   Consent:    Consent obtained:  Verbal   Consent given by:  Patient Universal protocol:    Patient identity confirmed:  Verbally with patient Anesthesia:    Anesthesia method:  Topical application   Topical anesthetic:  LET Laceration details:    Location:  Lip   Lip location:  Lower exterior lip   Length (cm):  2.6 Exploration:    Contaminated: no   Treatment:    Area  cleansed with:  Saline and povidone-iodine   Amount of cleaning:  Standard   Irrigation method:  Pressure wash and tap   Visualized foreign bodies/material removed: no   Skin repair:    Repair method:  Sutures   Suture size:  6-0   Suture material:  Prolene   Suture technique:  Simple interrupted   Number of sutures:  3 Approximation:    Approximation:  Close Repair type:    Repair type:  Intermediate Post-procedure details:    Procedure completion:  Tolerated well, no immediate complications     Medications Ordered in ED Medications  lidocaine-EPINEPHrine-tetracaine (LET) topical gel (has no administration in time range)    ED Course/ Medical Decision Making/ A&P  Medical Decision Making Amount and/or Complexity of Data Reviewed Radiology: ordered.  72 year old male with fall earlier today.  Patient has right cheek on concrete.  Denies headache, LOC nausea vomiting but having right-sided facial pain with mild swelling.  CT maxillofacial negative for any acute bony abnormality.  He did have a small laceration to the lip, this was repaired with 3 Prolene sutures.  Patient appears well, no neurological deficits.  Patient educated on wound care and follow-up.  He understands signs symptoms return to the ER for. Final Clinical Impression(s) / ED Diagnoses Final diagnoses:  Contusion of face, initial encounter  Lip laceration, initial encounter    Rx / DC Orders ED Discharge Orders     None         Ronnette Juniper 09/28/22 1647    Dionne Bucy, MD 09/28/22 1924

## 2022-09-28 NOTE — ED Notes (Signed)
First Nurse Note: Pt [place] ED via [facility] [place]. Pt does not remember falling. Pt did hit his head. Pt is not on blood thinners. Pt has a small laceration on the right side of his mouth. Pt admits to ETOH. Pt also had another fall earlier today but did not have any injuries. EMS reports that pt is confused with them, which is not his baseline.   [phone] Forest- Ex-wife.

## 2022-10-15 ENCOUNTER — Ambulatory Visit (HOSPITAL_COMMUNITY)
Admission: EM | Admit: 2022-10-15 | Discharge: 2022-10-15 | Disposition: A | Discharge status: Home / Self Care | Payer: Medicare HMO | Attending: Nurse Practitioner | Admitting: Nurse Practitioner

## 2022-10-15 DIAGNOSIS — F1010 Alcohol abuse, uncomplicated: Secondary | ICD-10-CM | POA: Insufficient documentation

## 2022-10-15 DIAGNOSIS — F1910 Other psychoactive substance abuse, uncomplicated: Secondary | ICD-10-CM | POA: Diagnosis not present

## 2022-10-15 NOTE — ED Provider Notes (Signed)
[facility] Medical Screening Exam  Patient Name: Jesse Addie Sr. MRN: [id number] Date of Evaluation: 10/15/22 Chief Complaint:   Diagnosis:  Final diagnoses:  Substance abuse (HCC)  Alcohol abuse    History of Present illness: Jesse Dearmon Sr. is a 72 y.o. male. With no pertinent psychiatric history, who presented voluntarily to [facility] as a walk-in, accompanied by his ex-wife, requesting treatment for substance abuse.  Patient was seen face to face by this provider and chart reviewed.Patient was evaluated alone. On evaluation, patient is alert, oriented x 4, and cooperative. Speech is clear, normal rate and coherent. Pt appears well groomed. Eye contact is good. Mood is euthymic, affect is congruent with mood. Thought process is coherent and thought content is WDL. Pt denies SI/HI/AVH. There is no objective indication that the patient is responding to internal stimuli. No delusions elicited during this assessment.     Patient reports " I'm here for drinking gin, I drink about 1 pint weekly, and I have been drinking since I was 16.  Patient reports he drunk half a pint or less of gin today.  He denies a history of alcohol withdrawal seizures/symptoms, patient denies other substance use".  Patient reports he lives with his ex-wife.  Patient denies the presence or access to a gun or weapon at home.  Patient denies history of psychiatric diagnosis, denies a history of inpatient substance abuse rehab/alcohol treatment.  Patient reports he is not on any psychiatric medications, and denies a history of suicide attempts.  He reports his sleep and appetite are good.  Support, encouragement and reassurance provided about ongoing stressors.  Discussed recommendation for discharge home and follow up with outpatient substance abuse treatment programs. Patient provided resources and opportunity for questions. Patient is in agreement.   Flowsheet Row ED from 10/15/2022 in [facility] ED from 09/28/2022 in [facility] Emergency Department at [facility] ED from 09/16/2022 in [facility] Emergency Department at [facility]  C-SSRS RISK CATEGORY No Risk No Risk No Risk       Psychiatric Specialty Exam  Presentation  General Appearance:Appropriate for Environment  Eye Contact:Good  Speech:Clear and Coherent  Speech Volume:Normal  Handedness:Right   Mood and Affect  Mood: Euthymic  Affect: Congruent   Thought Process  Thought Processes: Coherent  Descriptions of Associations:Intact  Orientation:Full (Time, Place and Person)  Thought Content:WDL    Hallucinations:No data recorded Ideas of Reference:None  Suicidal Thoughts:No  Homicidal Thoughts:No   Sensorium  Memory:No data recorded Judgment: Fair  Insight: Good   Executive Functions  Concentration: Good  Attention Span: Good  Recall: Good  Fund of Knowledge: Good  Language: Good   Psychomotor Activity  Psychomotor Activity: Normal   Assets  Assets: Communication Skills; Desire for Improvement; Social Support; Resilience   Sleep  Sleep: Good  Number of hours: No data recorded  Physical Exam: Physical Exam Constitutional:      General: He is not in acute distress.    Appearance: He is not diaphoretic.  HENT:     Head: Normocephalic.     Right Ear: External ear normal.     Left Ear: External ear normal.     Nose: No congestion.  Eyes:     General:        Right eye: No discharge.        Left eye: No discharge.  Cardiovascular:     Rate and Rhythm: Normal rate.  Pulmonary:     Effort: No  respiratory distress.  Chest:     Chest wall: No tenderness.  Neurological:     Mental Status: He is alert and oriented to person, place, and time.  Psychiatric:        Attention and Perception: Attention and perception normal.        Mood and Affect: Mood and affect normal.        Speech: Speech normal.         Behavior: Behavior is cooperative.        Thought Content: Thought content normal. Thought content is not paranoid or delusional. Thought content does not include homicidal or suicidal ideation. Thought content does not include homicidal or suicidal plan.        Cognition and Memory: Cognition and memory normal.        Judgment: Judgment normal.    Review of Systems  Constitutional:  Negative for diaphoresis and fever.  HENT:  Negative for congestion.   Eyes:  Negative for discharge.  Respiratory:  Negative for cough, shortness of breath and wheezing.   Cardiovascular:  Negative for chest pain and palpitations.  Gastrointestinal:  Negative for diarrhea, nausea and vomiting.  Neurological:  Negative for dizziness and headaches.  Psychiatric/Behavioral:  Negative for depression, memory loss and suicidal ideas. The patient does not have insomnia.    Blood pressure (!) 157/93, pulse 74, temperature 98.2 F (36.8 C), temperature source Oral, resp. rate 18, SpO2 100 %. There is no height or weight on file to calculate BMI.  Musculoskeletal: Strength & Muscle Tone: within normal limits Gait & Station: normal Patient leans: N/A   [facility] MSE Discharge Disposition for Follow up and Recommendations: Based on my evaluation the patient does not appear to have an emergency medical condition and can be discharged with resources and follow up care in outpatient services for Substance Abuse Intensive Outpatient Program  Recommend discharge home and follow up with outpatient substance abuse treatment program. Resources provided.   Please refrain from using alcohol or illicit substances, as they can affect your mood and can cause depression, anxiety or other concerning symptoms.  Alcohol can increase the chance that a person will make reckless decisions, like attempting suicide, and can increase the lethality of a drug overdose.    Discussed crisis plan, calling 911 or returning to the ED if condition  changes or worsens. Discussed follow up with PCP for medical issues. Concerns or health needs. Patient verbalized his understanding.   Patient discharged and condition at discharge is stable.   Mancel Bale, NP 10/15/2022, 9:08 PM

## 2022-10-15 NOTE — Progress Notes (Signed)
   10/15/22 2029  [facility] Triage Screening (Walk-ins at [facility] only)  How Did You Hear About [place]? Family/Friend  What Is the Reason for Your Visit/Call Today? Pt presents to [facility] voluntarily, accompanied by his ex-wife requesting treatment for substance abuse. Pt initially reported that he did not need to be here at [facility] and that his ex-wife was concerned for his well-being. Pt reports drinking a pint of Gin weekly. Pt states " I do have heart issues so that is why it can be a problem". Pt reports drinking 2 beers within the last 24 hours.Pt denies SI,HI, AVH.  How Long Has This Been Causing You Problems? 1-6 months  Have You Recently Had Any Thoughts About Hurting Yourself? No  Are You Planning to Commit Suicide/Harm Yourself At This time? No  Have you Recently Had Thoughts About Hurting Someone Karolee Ohs? No  Are You Planning To Harm Someone At This Time? No  Are you currently experiencing any auditory, visual or other hallucinations? No  Have You Used Any Alcohol or Drugs in the Past 24 Hours? Yes  How long ago did you use Drugs or Alcohol? yesterday  What Did You Use and How Much? 2 beers  Do you have any current medical co-morbidities that require immediate attention? Yes  Please describe current medical co-morbidities that require immediate attention: heart condition, high blood pressure  Clinician description of patient physical appearance/behavior: pt is calm, cooperative and alert  What Do You Feel Would Help You the Most Today? Alcohol or Drug Use Treatment  If access to [facility] was not available, would you have sought care in the Emergency Department? No  Determination of Need Routine (7 days)  Options For Referral Other: Comment;Outpatient Therapy;Chemical Dependency Intensive Outpatient Therapy (CDIOP)

## 2022-10-15 NOTE — Discharge Instructions (Signed)

## 2023-03-25 ENCOUNTER — Encounter: Payer: Self-pay | Admitting: *Deleted

## 2023-03-25 ENCOUNTER — Emergency Department: Payer: Medicare HMO

## 2023-03-25 ENCOUNTER — Emergency Department
Admission: EM | Admit: 2023-03-25 | Discharge: 2023-03-25 | Disposition: A | Discharge status: Home / Self Care | Payer: Medicare HMO | Attending: Emergency Medicine | Admitting: Emergency Medicine

## 2023-03-25 ENCOUNTER — Other Ambulatory Visit: Payer: Self-pay

## 2023-03-25 DIAGNOSIS — F10120 Alcohol abuse with intoxication, uncomplicated: Secondary | ICD-10-CM | POA: Insufficient documentation

## 2023-03-25 DIAGNOSIS — R079 Chest pain, unspecified: Secondary | ICD-10-CM | POA: Insufficient documentation

## 2023-03-25 DIAGNOSIS — Y906 Blood alcohol level of 120-199 mg/100 ml: Secondary | ICD-10-CM | POA: Diagnosis not present

## 2023-03-25 DIAGNOSIS — F10920 Alcohol use, unspecified with intoxication, uncomplicated: Secondary | ICD-10-CM

## 2023-03-25 DIAGNOSIS — I509 Heart failure, unspecified: Secondary | ICD-10-CM | POA: Insufficient documentation

## 2023-03-25 DIAGNOSIS — I110 Hypertensive heart disease with heart failure: Secondary | ICD-10-CM | POA: Diagnosis not present

## 2023-03-25 LAB — TROPONIN I (HIGH SENSITIVITY)
Troponin I (High Sensitivity): 14 ng/L (ref <18)
Troponin I (High Sensitivity): 14 ng/L (ref <18)

## 2023-03-25 LAB — CBC WITH DIFFERENTIAL/PLATELET
Abs Immature Granulocytes: 0.01 10*3/uL (ref 0.00–0.07)
Basophils Absolute: 0 10*3/uL (ref 0.0–0.1)
Basophils Relative: 0 %
Eosinophils Absolute: 0.2 10*3/uL (ref 0.0–0.5)
Eosinophils Relative: 2 %
HCT: 38.6 % — ABNORMAL LOW (ref 39.0–52.0)
Hemoglobin: 12.2 g/dL — ABNORMAL LOW (ref 13.0–17.0)
Immature Granulocytes: 0 %
Lymphocytes Relative: 37 %
Lymphs Abs: 2.8 10*3/uL (ref 0.7–4.0)
MCH: 23.9 pg — ABNORMAL LOW (ref 26.0–34.0)
MCHC: 31.6 g/dL (ref 30.0–36.0)
MCV: 75.7 fL — ABNORMAL LOW (ref 80.0–100.0)
Monocytes Absolute: 0.9 10*3/uL (ref 0.1–1.0)
Monocytes Relative: 12 %
Neutro Abs: 3.8 10*3/uL (ref 1.7–7.7)
Neutrophils Relative %: 49 %
Platelets: 236 10*3/uL (ref 150–400)
RBC: 5.1 MIL/uL (ref 4.22–5.81)
RDW: 15.8 % — ABNORMAL HIGH (ref 11.5–15.5)
WBC: 7.6 10*3/uL (ref 4.0–10.5)
nRBC: 0 % (ref 0.0–0.2)

## 2023-03-25 LAB — COMPREHENSIVE METABOLIC PANEL
ALT: 19 U/L (ref 0–44)
AST: 31 U/L (ref 15–41)
Albumin: 4 g/dL (ref 3.5–5.0)
Alkaline Phosphatase: 67 U/L (ref 38–126)
Anion gap: 9 (ref 5–15)
BUN: 10 mg/dL (ref 8–23)
CO2: 27 mmol/L (ref 22–32)
Calcium: 9.4 mg/dL (ref 8.9–10.3)
Chloride: 104 mmol/L (ref 98–111)
Creatinine, Ser: 1.06 mg/dL (ref 0.61–1.24)
GFR, Estimated: >60 mL/min (ref >60)
Glucose, Bld: 106 mg/dL — ABNORMAL HIGH (ref 70–99)
Potassium: 3.4 mmol/L — ABNORMAL LOW (ref 3.5–5.1)
Sodium: 140 mmol/L (ref 135–145)
Total Bilirubin: 0.8 mg/dL (ref <1.2)
Total Protein: 7.3 g/dL (ref 6.5–8.1)

## 2023-03-25 LAB — ETHANOL: Alcohol, Ethyl (B): 190 mg/dL — ABNORMAL HIGH (ref <10)

## 2023-03-25 LAB — BRAIN NATRIURETIC PEPTIDE: B Natriuretic Peptide: 161.6 pg/mL — ABNORMAL HIGH (ref 0.0–100.0)
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
## 2023-03-25 NOTE — ED Notes (Signed)
Pt reports chest pain since 1300 today.  Pt denies n/v/  pt reports a cough.   No fever.  Pt denies sob.  Pt admits to 2 etoh drinks today.  Md at bedside.  EKG done.  Labs sent.

## 2023-03-25 NOTE — ED Triage Notes (Signed)
Pt brought in via ems from home with chest pain.   No sob. Ems gave asa and ntg paste.  Ems report etoh use today.  Pt alert, slow to respond to questions.  Iv in place.

## 2023-03-26 NOTE — Patient Outreach (Signed)
  Care Coordination    03/26/2023 Name: Jesse Clover Sr. MRN: [id number] DOB: 1950/06/27  Jesse Fava Sr. is a 72 y.o. year old male who sees System, Provider Not In for primary care. I  placed a referral to [employer] in response to a referral received by ED provider indicating the patient is in need of Mental Health and Substance Abuse Services.   Follow up plan: No further intervention required. Referral made to [employer].    Bevelyn Ngo, BSW, CDP [facility]  [facility] Manager Population Health Direct Dial: [phone]  Fax: [phone]
